# Patient Record
Sex: Male | Born: 1980 | Race: White | Hispanic: No | Marital: Single | Smoking: Current every day smoker
Health system: Southern US, Community
[De-identification: ages and names within clinical notes are randomized; demographics above are authoritative.]

## PROBLEM LIST (undated history)

## (undated) HISTORY — PX: ABDOMINAL SURGERY: SHX537

---

## 2003-05-01 ENCOUNTER — Emergency Department (HOSPITAL_COMMUNITY): Admission: EM | Admit: 2003-05-01 | Discharge: 2003-05-01 | Payer: Self-pay | Admitting: Emergency Medicine

## 2003-05-02 ENCOUNTER — Emergency Department (HOSPITAL_COMMUNITY): Admission: EM | Admit: 2003-05-02 | Discharge: 2003-05-03 | Payer: Self-pay | Admitting: Emergency Medicine

## 2003-05-05 ENCOUNTER — Emergency Department (HOSPITAL_COMMUNITY): Admission: EM | Admit: 2003-05-05 | Discharge: 2003-05-05 | Payer: Self-pay | Admitting: Emergency Medicine

## 2003-05-08 ENCOUNTER — Emergency Department (HOSPITAL_COMMUNITY): Admission: EM | Admit: 2003-05-08 | Discharge: 2003-05-08 | Payer: Self-pay | Admitting: Emergency Medicine

## 2003-09-04 ENCOUNTER — Emergency Department (HOSPITAL_COMMUNITY): Admission: EM | Admit: 2003-09-04 | Discharge: 2003-09-04 | Payer: Self-pay | Admitting: Emergency Medicine

## 2003-09-06 ENCOUNTER — Emergency Department (HOSPITAL_COMMUNITY): Admission: EM | Admit: 2003-09-06 | Discharge: 2003-09-07 | Payer: Self-pay | Admitting: Emergency Medicine

## 2003-11-23 ENCOUNTER — Emergency Department (HOSPITAL_COMMUNITY): Admission: EM | Admit: 2003-11-23 | Discharge: 2003-11-23 | Payer: Self-pay | Admitting: Emergency Medicine

## 2003-12-02 ENCOUNTER — Emergency Department: Payer: Self-pay | Admitting: Emergency Medicine

## 2004-03-02 ENCOUNTER — Emergency Department (HOSPITAL_COMMUNITY): Admission: EM | Admit: 2004-03-02 | Discharge: 2004-03-02 | Payer: Self-pay | Admitting: Emergency Medicine

## 2004-03-06 ENCOUNTER — Emergency Department (HOSPITAL_COMMUNITY): Admission: EM | Admit: 2004-03-06 | Discharge: 2004-03-06 | Payer: Self-pay | Admitting: *Deleted

## 2004-03-24 ENCOUNTER — Emergency Department (HOSPITAL_COMMUNITY): Admission: EM | Admit: 2004-03-24 | Discharge: 2004-03-24 | Payer: Self-pay | Admitting: Emergency Medicine

## 2004-04-06 ENCOUNTER — Emergency Department (HOSPITAL_COMMUNITY): Admission: EM | Admit: 2004-04-06 | Discharge: 2004-04-06 | Payer: Self-pay | Admitting: Emergency Medicine

## 2004-04-07 ENCOUNTER — Emergency Department (HOSPITAL_COMMUNITY): Admission: EM | Admit: 2004-04-07 | Discharge: 2004-04-07 | Payer: Self-pay | Admitting: Emergency Medicine

## 2004-05-06 ENCOUNTER — Emergency Department: Payer: Self-pay | Admitting: Emergency Medicine

## 2004-05-07 ENCOUNTER — Emergency Department: Payer: Self-pay | Admitting: Unknown Physician Specialty

## 2004-05-15 ENCOUNTER — Emergency Department: Payer: Self-pay | Admitting: Unknown Physician Specialty

## 2004-05-19 ENCOUNTER — Emergency Department: Payer: Self-pay | Admitting: Emergency Medicine

## 2004-05-20 ENCOUNTER — Emergency Department: Payer: Self-pay | Admitting: Unknown Physician Specialty

## 2004-05-22 ENCOUNTER — Emergency Department: Payer: Self-pay | Admitting: Unknown Physician Specialty

## 2004-06-14 ENCOUNTER — Emergency Department: Payer: Self-pay | Admitting: Emergency Medicine

## 2004-06-15 ENCOUNTER — Emergency Department: Payer: Self-pay | Admitting: Emergency Medicine

## 2004-08-06 ENCOUNTER — Emergency Department: Payer: Self-pay | Admitting: Emergency Medicine

## 2004-08-25 ENCOUNTER — Emergency Department: Payer: Self-pay | Admitting: Emergency Medicine

## 2004-09-04 ENCOUNTER — Emergency Department: Payer: Self-pay | Admitting: Internal Medicine

## 2004-09-16 ENCOUNTER — Emergency Department (HOSPITAL_COMMUNITY): Admission: EM | Admit: 2004-09-16 | Discharge: 2004-09-16 | Payer: Self-pay | Admitting: Emergency Medicine

## 2004-09-24 ENCOUNTER — Emergency Department (HOSPITAL_COMMUNITY): Admission: EM | Admit: 2004-09-24 | Discharge: 2004-09-24 | Payer: Self-pay | Admitting: Emergency Medicine

## 2004-10-03 ENCOUNTER — Emergency Department: Payer: Self-pay | Admitting: Emergency Medicine

## 2004-11-05 ENCOUNTER — Emergency Department: Payer: Self-pay | Admitting: Unknown Physician Specialty

## 2004-11-13 ENCOUNTER — Emergency Department (HOSPITAL_COMMUNITY): Admission: EM | Admit: 2004-11-13 | Discharge: 2004-11-13 | Payer: Self-pay | Admitting: Emergency Medicine

## 2004-11-20 ENCOUNTER — Emergency Department: Payer: Self-pay | Admitting: Emergency Medicine

## 2004-11-20 ENCOUNTER — Emergency Department (HOSPITAL_COMMUNITY): Admission: EM | Admit: 2004-11-20 | Discharge: 2004-11-21 | Payer: Self-pay | Admitting: *Deleted

## 2004-11-27 ENCOUNTER — Emergency Department (HOSPITAL_COMMUNITY): Admission: EM | Admit: 2004-11-27 | Discharge: 2004-11-27 | Payer: Self-pay | Admitting: Emergency Medicine

## 2004-12-08 ENCOUNTER — Emergency Department: Payer: Self-pay | Admitting: Internal Medicine

## 2004-12-12 ENCOUNTER — Emergency Department: Payer: Self-pay | Admitting: Unknown Physician Specialty

## 2004-12-28 ENCOUNTER — Emergency Department (HOSPITAL_COMMUNITY): Admission: EM | Admit: 2004-12-28 | Discharge: 2004-12-28 | Payer: Self-pay | Admitting: Emergency Medicine

## 2005-03-20 ENCOUNTER — Emergency Department (HOSPITAL_COMMUNITY): Admission: EM | Admit: 2005-03-20 | Discharge: 2005-03-20 | Payer: Self-pay | Admitting: Emergency Medicine

## 2005-03-23 ENCOUNTER — Inpatient Hospital Stay: Payer: Self-pay | Admitting: Unknown Physician Specialty

## 2005-04-08 ENCOUNTER — Emergency Department (HOSPITAL_COMMUNITY): Admission: EM | Admit: 2005-04-08 | Discharge: 2005-04-08 | Payer: Self-pay | Admitting: Emergency Medicine

## 2005-04-10 ENCOUNTER — Emergency Department: Payer: Self-pay | Admitting: Emergency Medicine

## 2005-05-31 ENCOUNTER — Inpatient Hospital Stay: Payer: Self-pay | Admitting: Internal Medicine

## 2005-05-31 ENCOUNTER — Other Ambulatory Visit: Payer: Self-pay

## 2005-06-02 ENCOUNTER — Inpatient Hospital Stay: Payer: Self-pay | Admitting: Unknown Physician Specialty

## 2005-06-09 ENCOUNTER — Emergency Department: Payer: Self-pay | Admitting: Emergency Medicine

## 2005-08-10 ENCOUNTER — Emergency Department: Payer: Self-pay

## 2005-08-11 ENCOUNTER — Emergency Department: Payer: Self-pay | Admitting: Emergency Medicine

## 2005-08-23 ENCOUNTER — Emergency Department: Payer: Self-pay | Admitting: Emergency Medicine

## 2005-09-09 ENCOUNTER — Emergency Department: Payer: Self-pay | Admitting: Emergency Medicine

## 2005-09-20 ENCOUNTER — Emergency Department: Payer: Self-pay | Admitting: Unknown Physician Specialty

## 2005-11-06 ENCOUNTER — Emergency Department: Payer: Self-pay | Admitting: Emergency Medicine

## 2005-11-11 IMAGING — CR DG LUMBAR SPINE 2-3V
1 series · 2 of 2 positions shown · non-contrast
Comparison: none

REASON FOR EXAM: fall
COMMENTS:

[Series 1: view not recorded · 0.17mm/px · 2 of 2 slices shown]
[im 1/2]
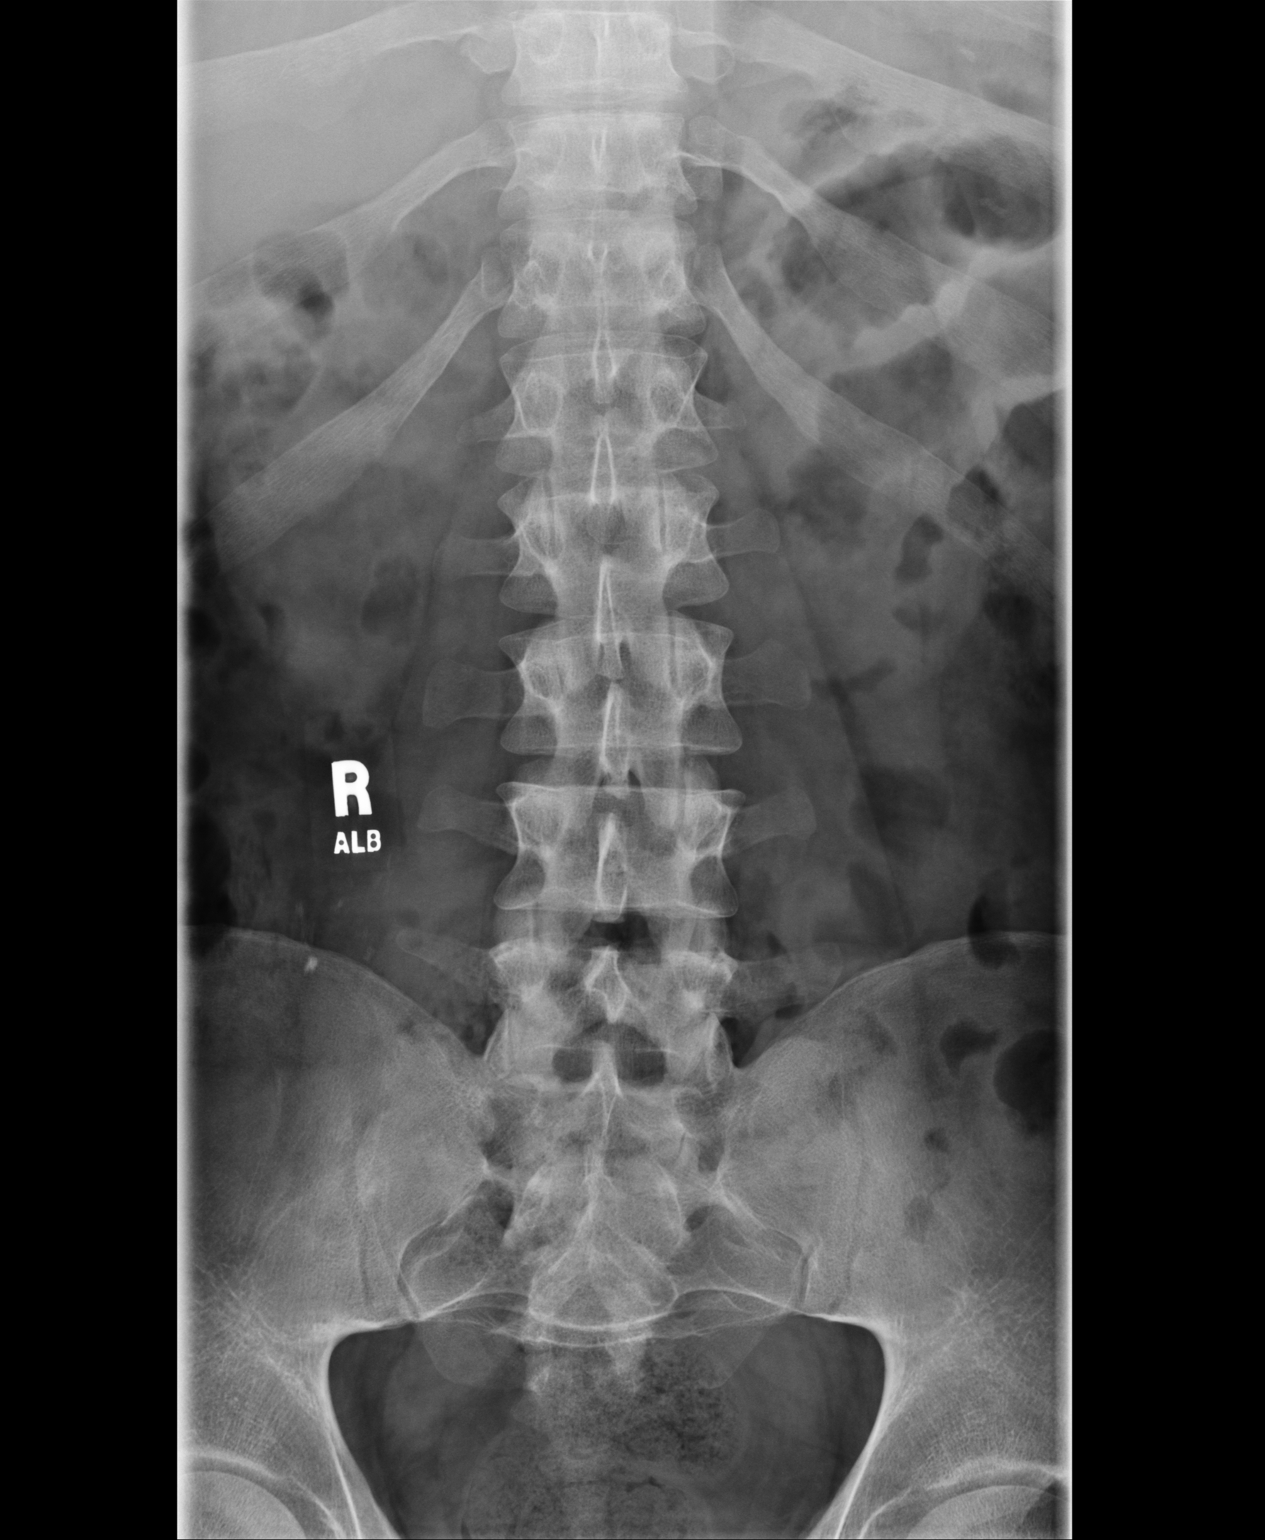
[im 2/2]
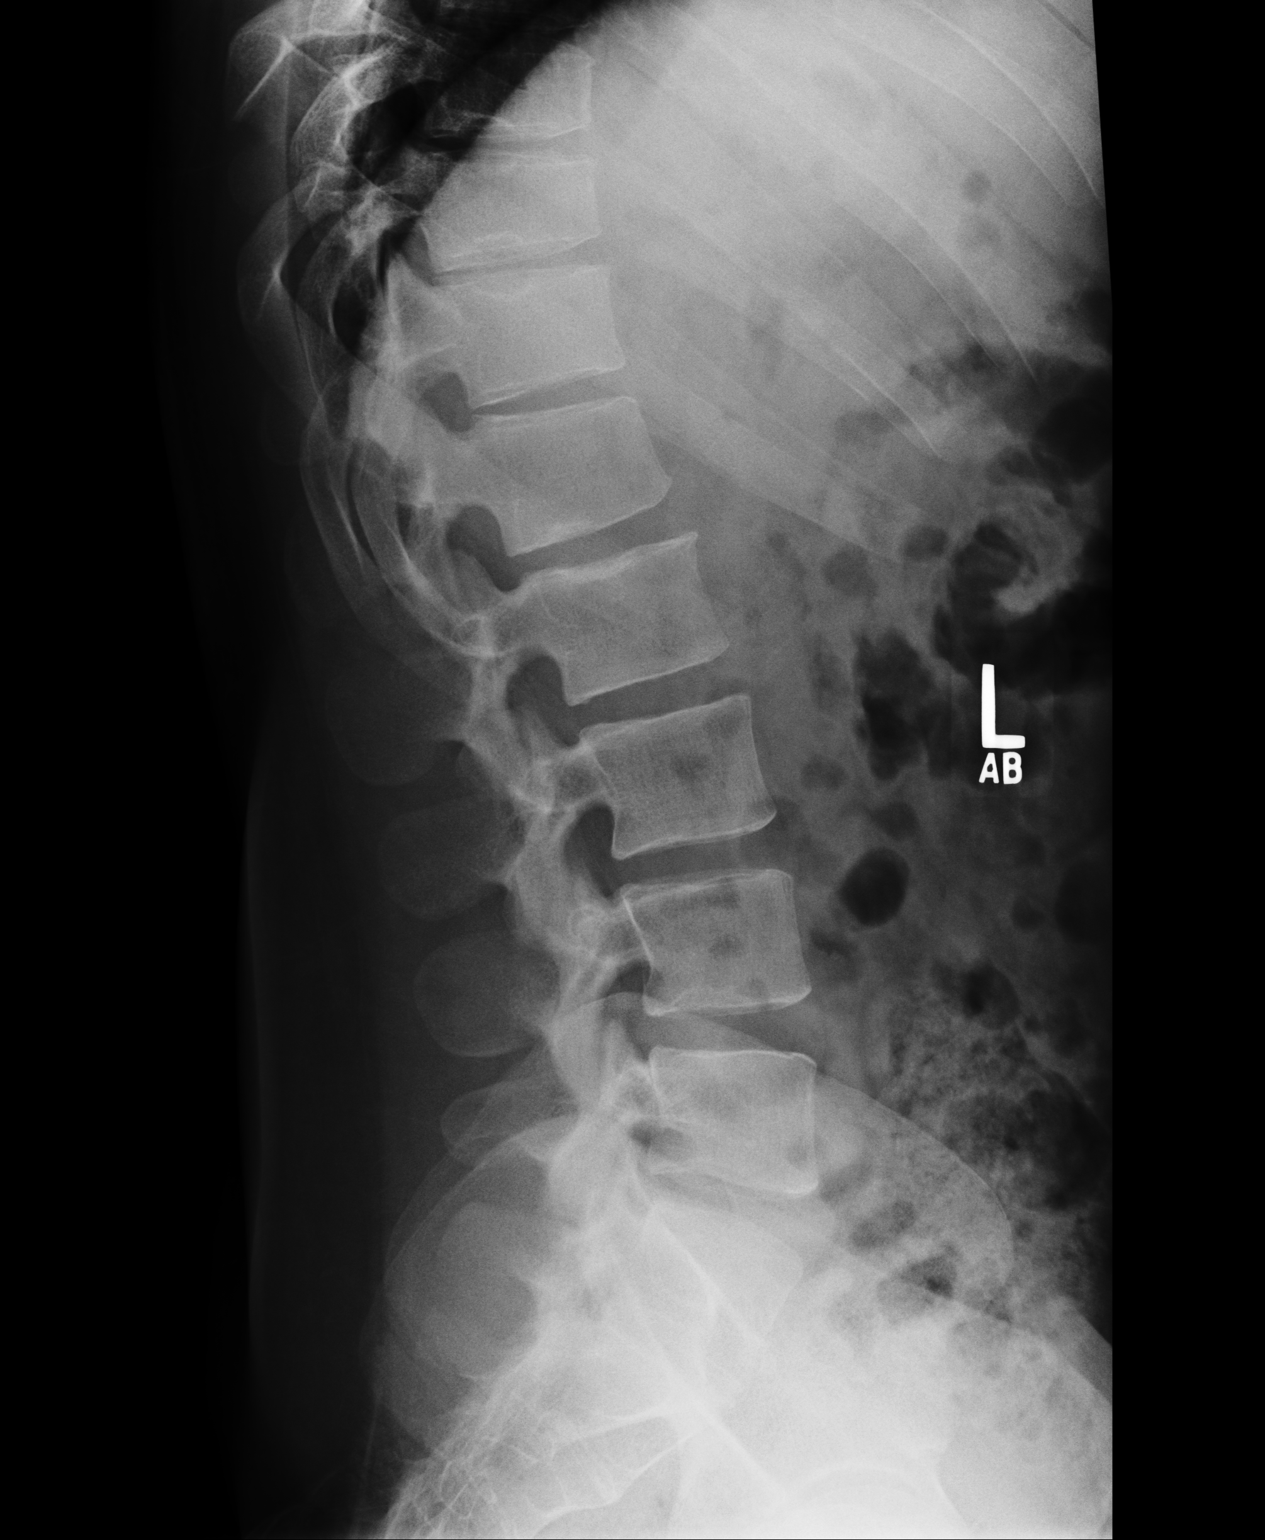

[2 of 2 positions shown; findings below may reference images not displayed]

PROCEDURE:     DXR - DXR LUMBAR SPINE AP AND LATERAL  - June 14, 2004  [DATE]

RESULT:        Comparison is made to the prior study which was just
performed on 05/20/04.  The vertebral body heights and intervertebral disc
spaces are maintained.  There is no evidence of fracture or subluxation.  No
significant degenerative change is seen.  There is no interval change.
IMPRESSION: No evidence of an acute lumbar spine bony abnormality.

## 2005-11-11 IMAGING — CR DG LUMBAR SPINE 1V
1 series · 1 of 1 positions shown · non-contrast
Comparison: none

REASON FOR EXAM: fall
COMMENTS:

PROCEDURE:     DXR - DXR LUMBAR SPINE ONE VIEW ONLY  - June 14, 2004 [DATE]
RESULT:        Cross-table lateral view of the lumbar spine shows normal
alignment.  The vertebral body heights are maintained.   No significant disc
space narrowing is seen.

[view not recorded]
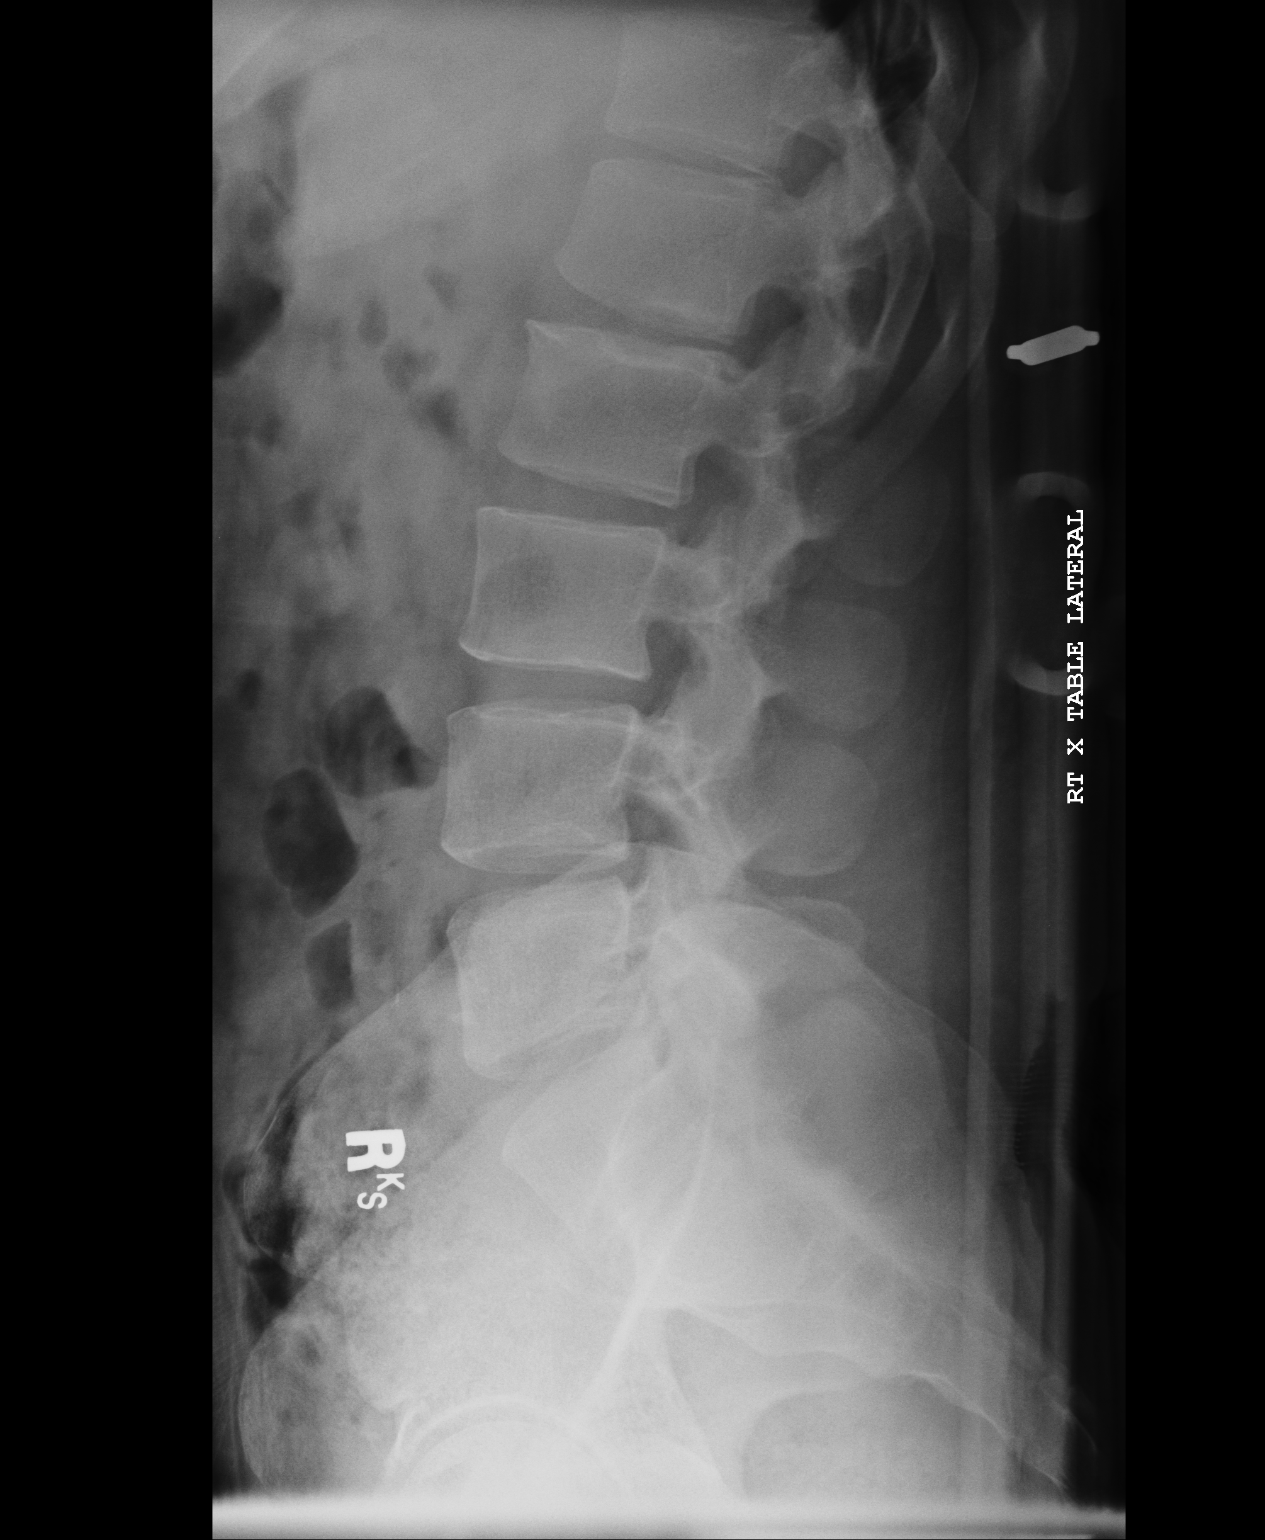

[1 of 1 positions shown; findings below may reference images not displayed]

IMPRESSION: See above.

## 2005-11-11 IMAGING — CR PELVIS - 1-2 VIEW
1 series · 1 of 1 positions shown · non-contrast
Comparison: none

REASON FOR EXAM: fall
COMMENTS:

[view not recorded]
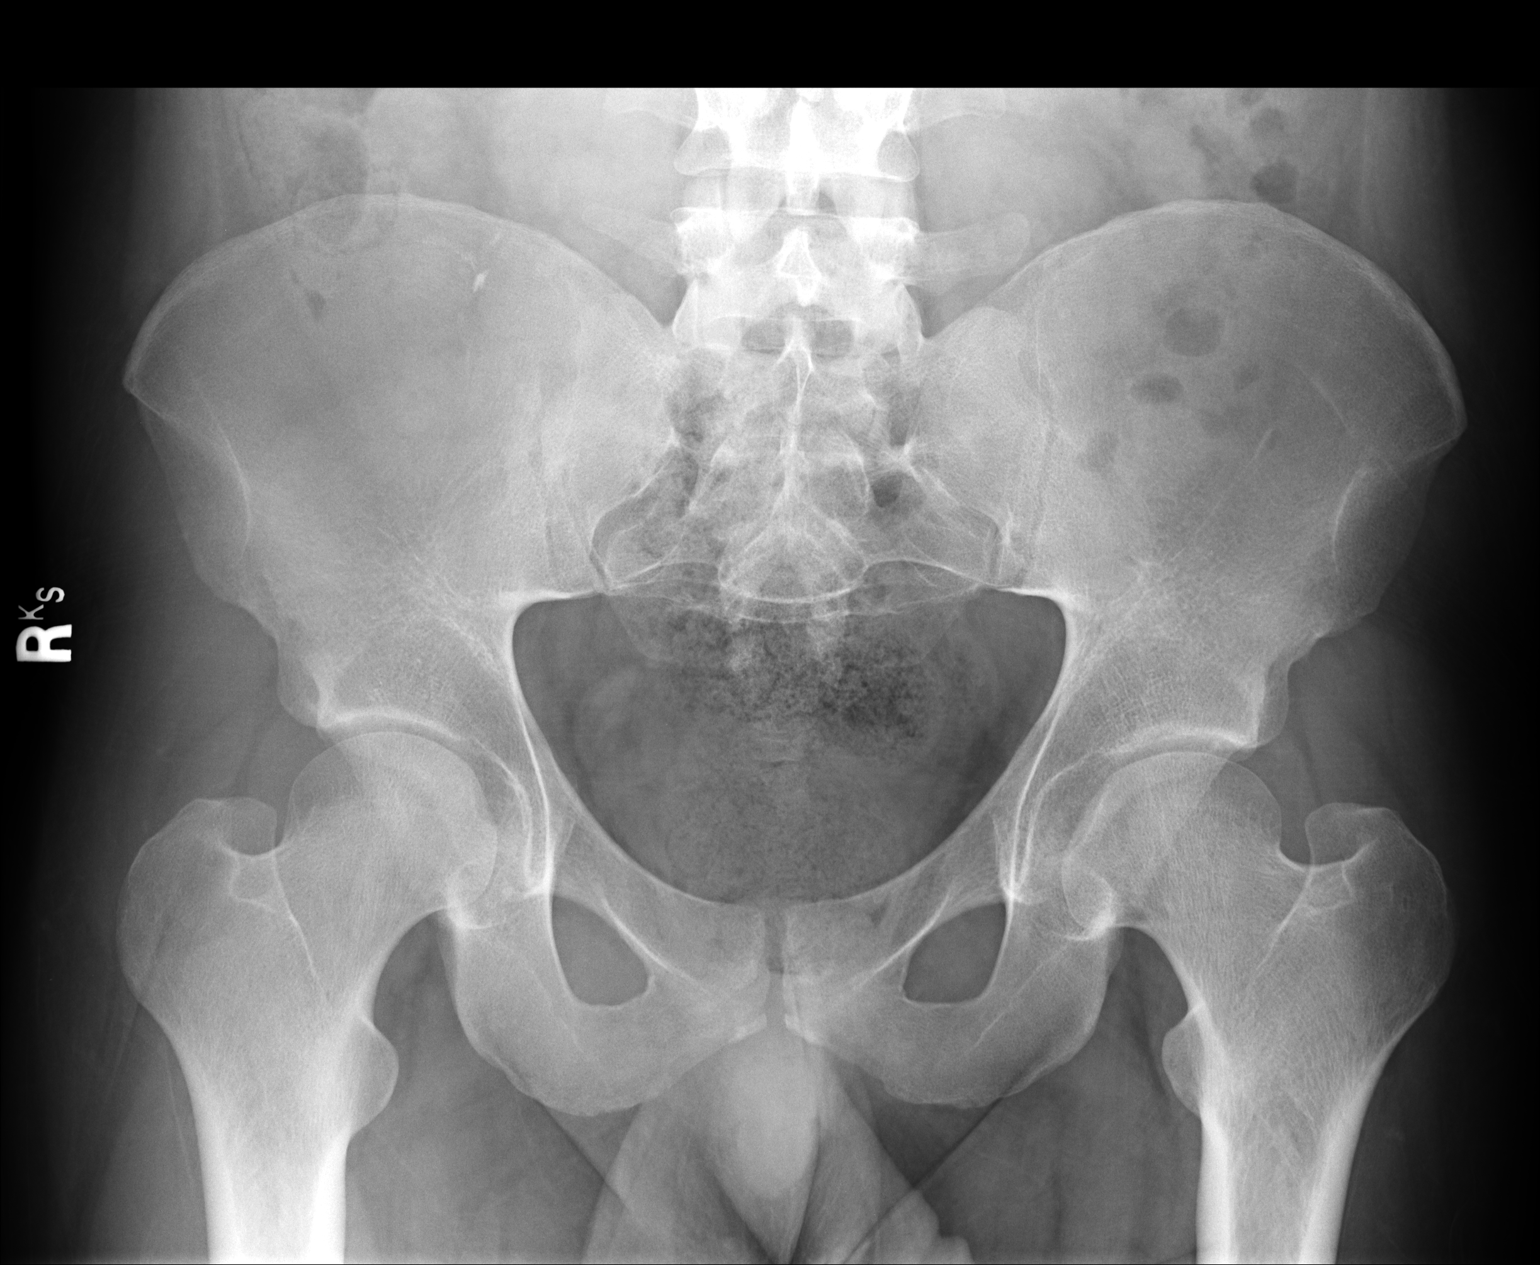

[1 of 1 positions shown; findings below may reference images not displayed]

PROCEDURE:     DXR - DXR PELVIS AP ONLY  - June 14, 2004 [DATE]

RESULT:       A single AP view of the pelvis shows normal appearance of the
pubic symphysis and sacroiliac joints. The sacrum appears intact.  There is
no evidence of fracture.  Some minimal density projects over the RIGHT iliac
crest and may represent some ingested material or medication.  The
visualized bowel gas pattern is unremarkable.
IMPRESSION: See above.

## 2006-04-11 ENCOUNTER — Emergency Department: Payer: Self-pay | Admitting: Emergency Medicine

## 2006-05-11 IMAGING — CR PELVIS - 1-2 VIEW
1 series · 2 of 2 positions shown · non-contrast
Comparison: none

REASON FOR EXAM: Fall
COMMENTS:  LMP: (Male)

PROCEDURE:     DXR - DXR PELVIS AP ONLY  - December 12, 2004  [DATE]
RESULT:          Comparison is made to a prior study dated 06/14/2004.
There is no evidence of fracture, dislocation, or malalignment.  A paucity
of air is seen within nondilated loops of bowel.

[Series 1: view not recorded · 0.17mm/px · 2 of 2 slices shown]
[im 1/2]
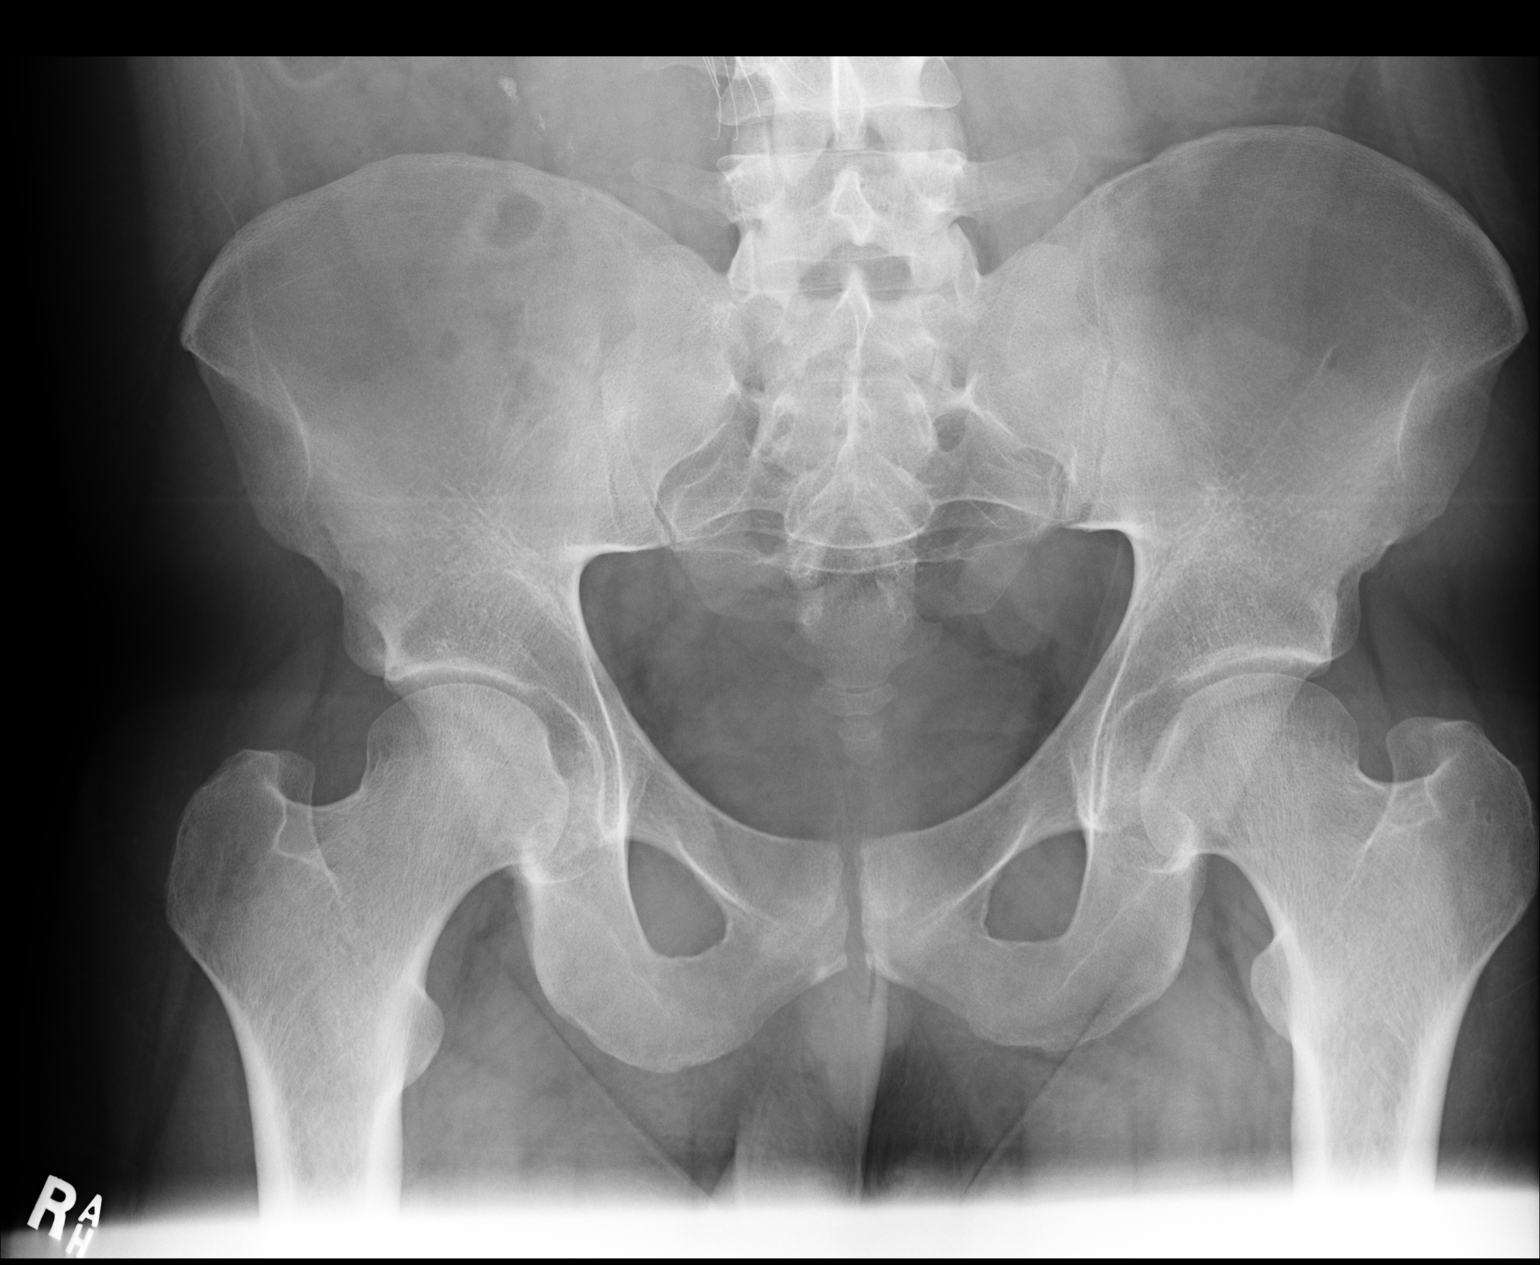
[im 2/2]
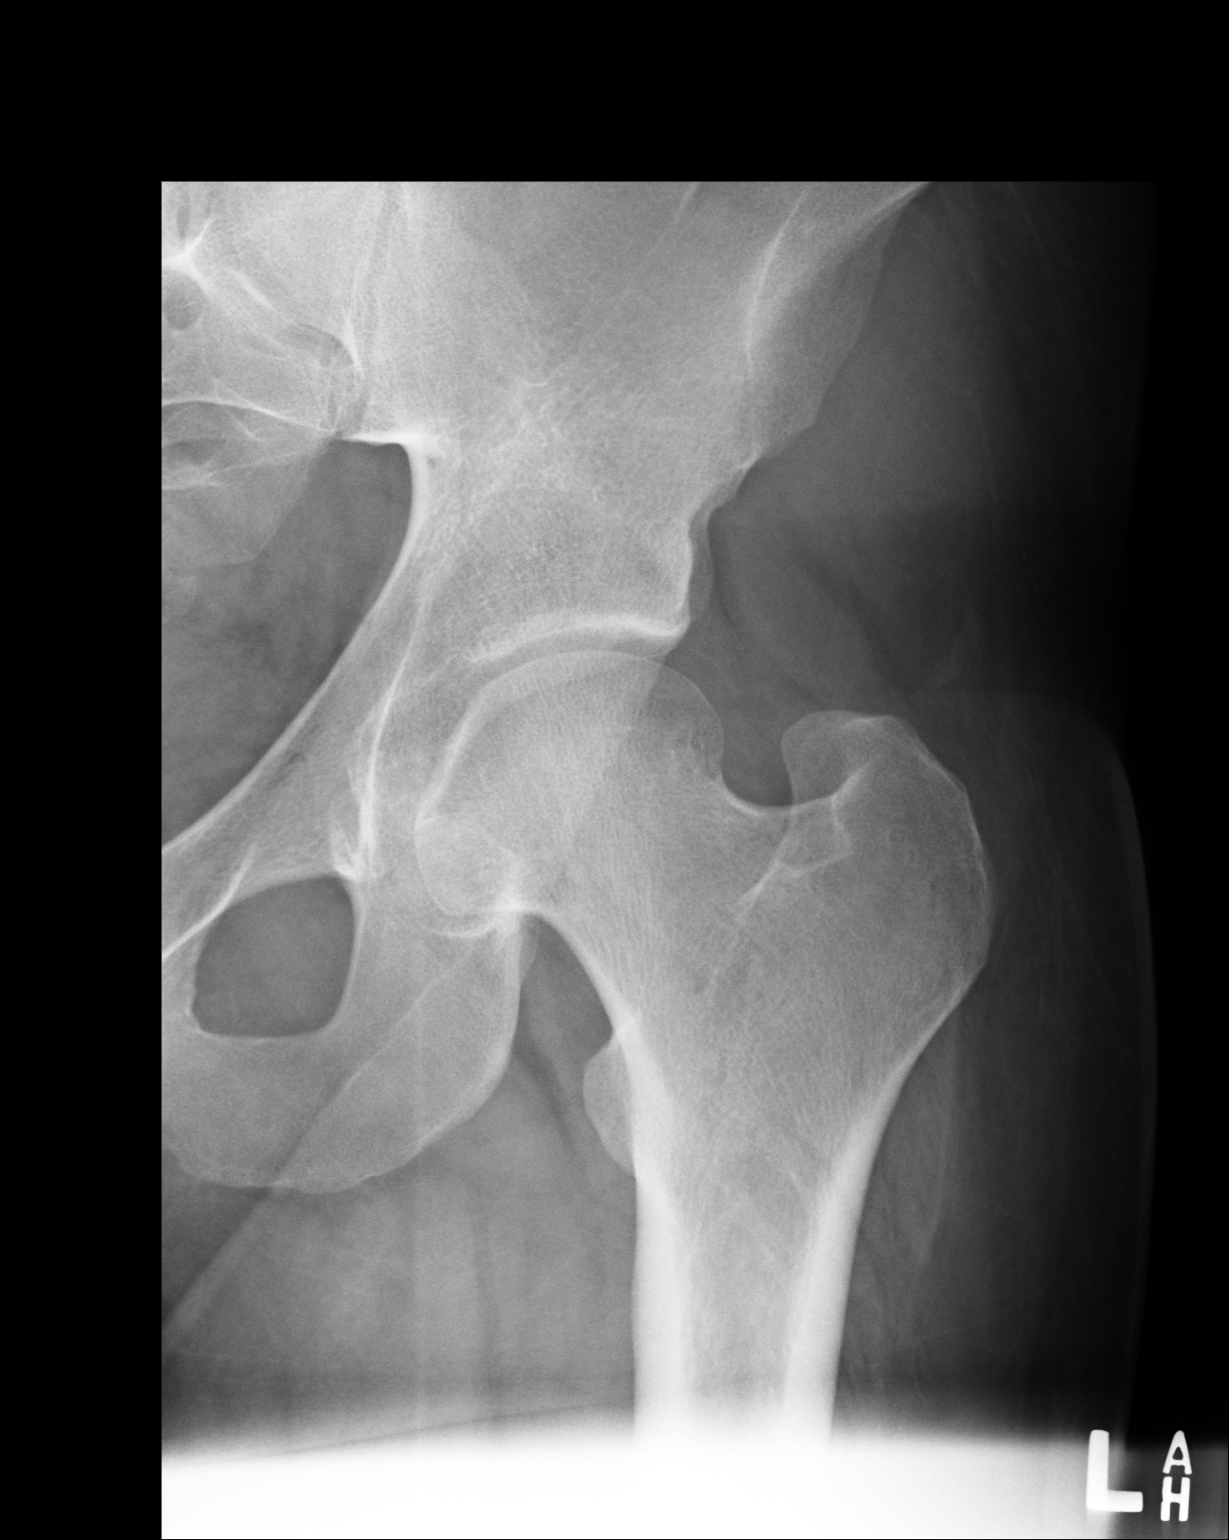

[2 of 2 positions shown; findings below may reference images not displayed]

IMPRESSION: 1.     No evidence of acute abnormalities.
2.     If there is persistent clinical concern or persistent complaints of
pain, repeat evaluation in 7-10 days is recommended, or further evaluation
with fracture protocol MRI.

## 2006-05-17 ENCOUNTER — Emergency Department: Payer: Self-pay | Admitting: General Practice

## 2007-03-18 ENCOUNTER — Emergency Department: Payer: Self-pay | Admitting: Emergency Medicine

## 2007-07-30 ENCOUNTER — Emergency Department: Payer: Self-pay | Admitting: Emergency Medicine

## 2007-10-17 ENCOUNTER — Emergency Department: Payer: Self-pay | Admitting: Emergency Medicine

## 2007-10-17 ENCOUNTER — Other Ambulatory Visit: Payer: Self-pay

## 2007-12-13 ENCOUNTER — Emergency Department: Payer: Self-pay | Admitting: Emergency Medicine

## 2008-01-06 ENCOUNTER — Emergency Department: Payer: Self-pay | Admitting: Emergency Medicine

## 2008-05-20 ENCOUNTER — Emergency Department: Payer: Self-pay | Admitting: Emergency Medicine

## 2008-07-09 ENCOUNTER — Emergency Department: Payer: Self-pay | Admitting: Emergency Medicine

## 2008-07-10 ENCOUNTER — Emergency Department: Payer: Self-pay | Admitting: Internal Medicine

## 2008-07-13 ENCOUNTER — Ambulatory Visit: Payer: Self-pay | Admitting: Surgery

## 2008-07-22 ENCOUNTER — Emergency Department: Payer: Self-pay | Admitting: Emergency Medicine

## 2008-07-27 ENCOUNTER — Inpatient Hospital Stay: Payer: Self-pay | Admitting: Vascular Surgery

## 2008-09-13 ENCOUNTER — Emergency Department: Payer: Self-pay | Admitting: Emergency Medicine

## 2008-10-05 ENCOUNTER — Emergency Department: Payer: Self-pay | Admitting: Emergency Medicine

## 2008-10-17 ENCOUNTER — Emergency Department: Payer: Self-pay | Admitting: Emergency Medicine

## 2008-12-04 ENCOUNTER — Emergency Department: Payer: Self-pay | Admitting: Emergency Medicine

## 2008-12-15 ENCOUNTER — Emergency Department: Payer: Self-pay | Admitting: Unknown Physician Specialty

## 2009-01-24 ENCOUNTER — Emergency Department: Payer: Self-pay | Admitting: Internal Medicine

## 2009-02-26 ENCOUNTER — Emergency Department: Payer: Self-pay | Admitting: Unknown Physician Specialty

## 2009-04-11 ENCOUNTER — Emergency Department: Payer: Self-pay | Admitting: Emergency Medicine

## 2009-04-18 ENCOUNTER — Emergency Department: Payer: Self-pay | Admitting: Emergency Medicine

## 2009-06-04 IMAGING — CR DG ABDOMEN 3V
1 series · 5 of 5 positions shown · non-contrast
Comparison: none

REASON FOR EXAM: Pain
COMMENTS:

[Series 1: view not recorded · 0.17mm/px · 5 of 5 slices shown]
[im 1/5]
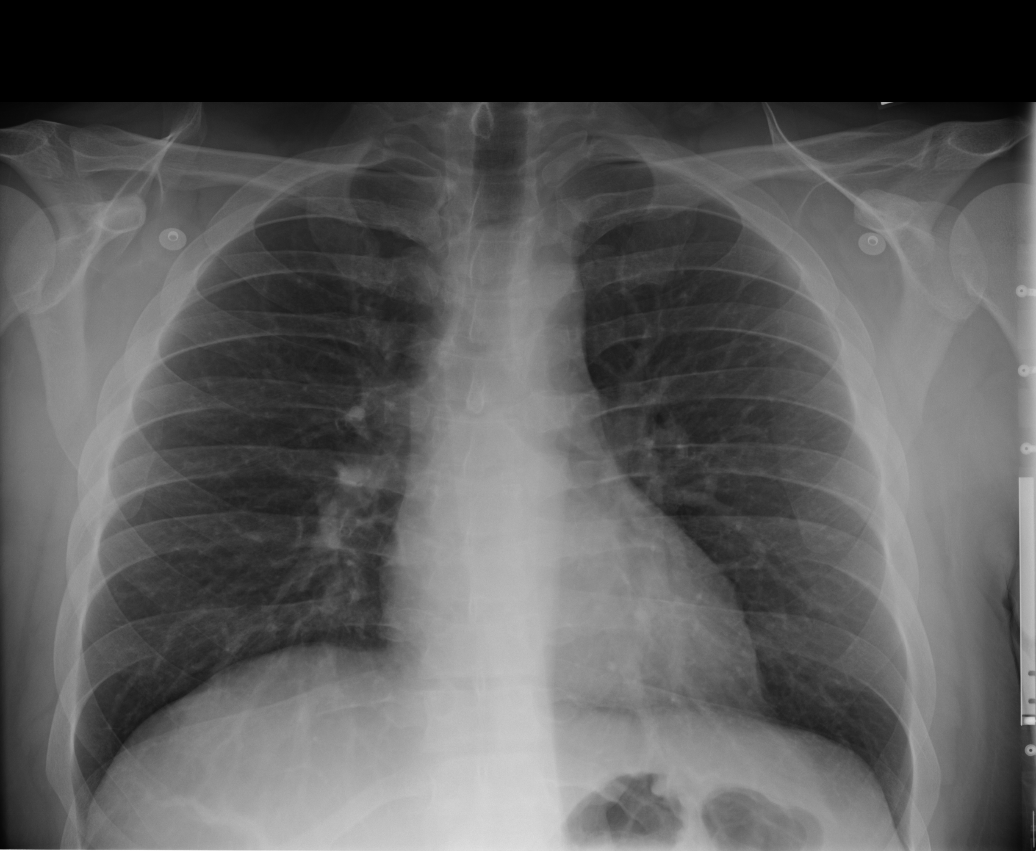
[im 2/5]
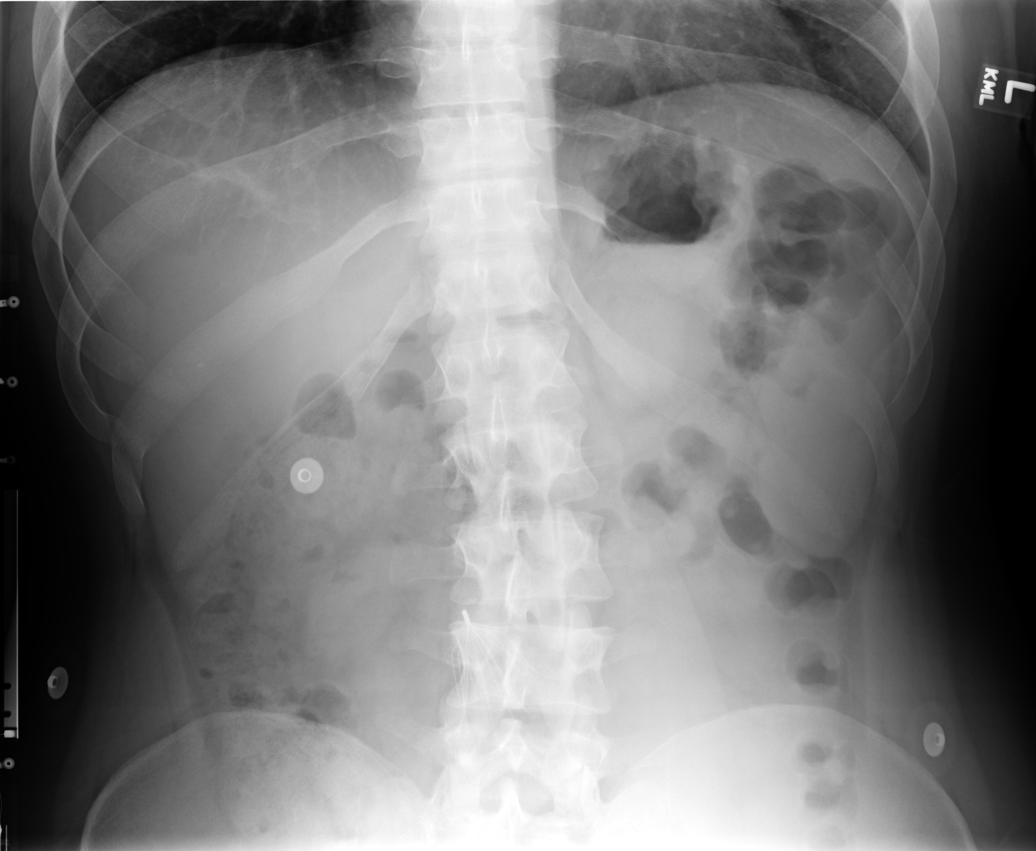
[im 3/5]
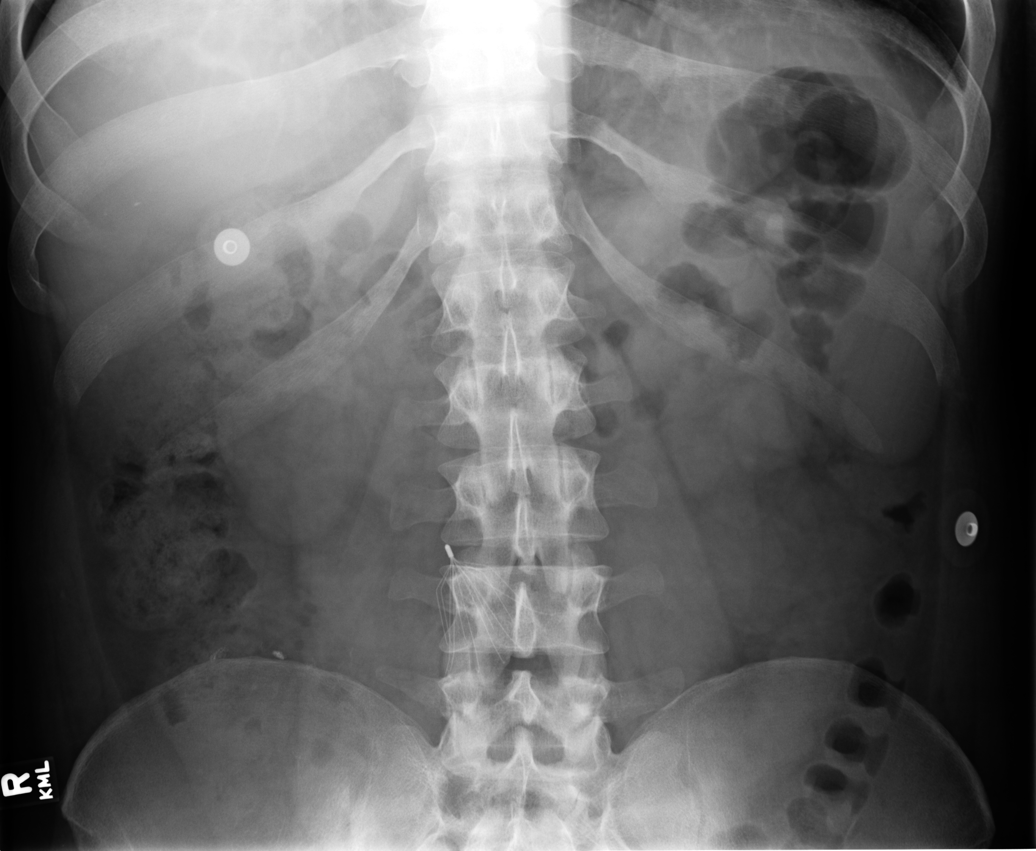
[im 4/5]
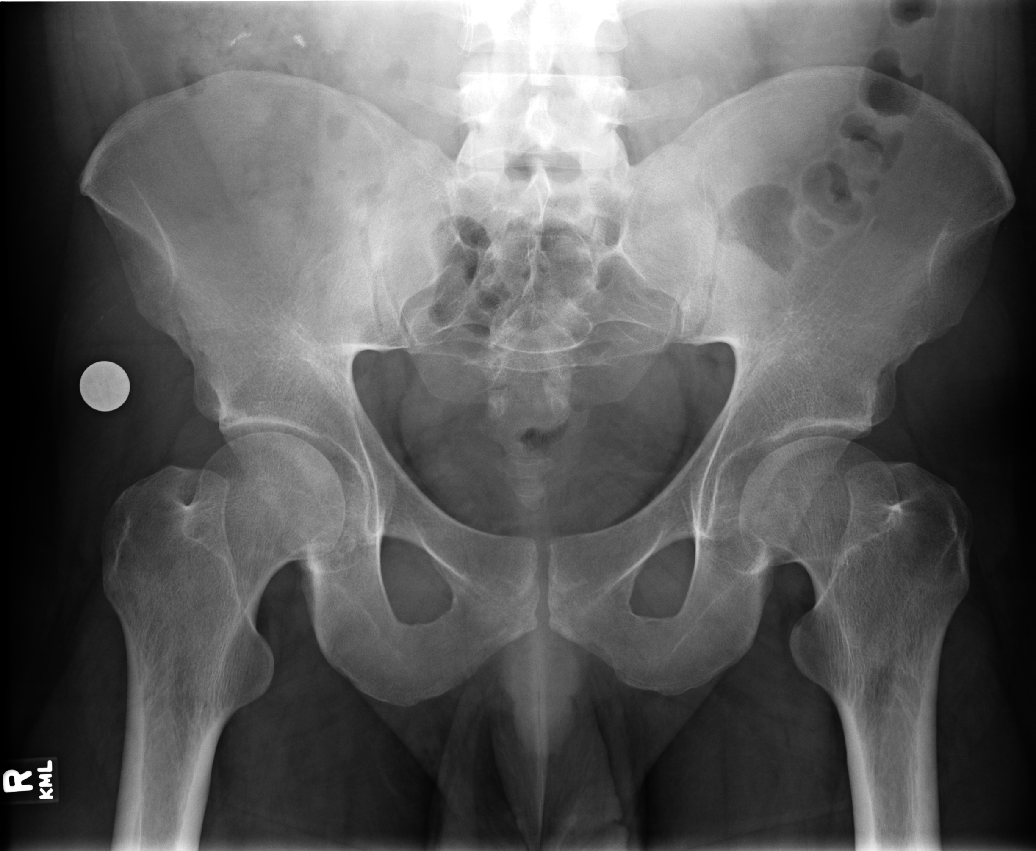
[im 5/5]
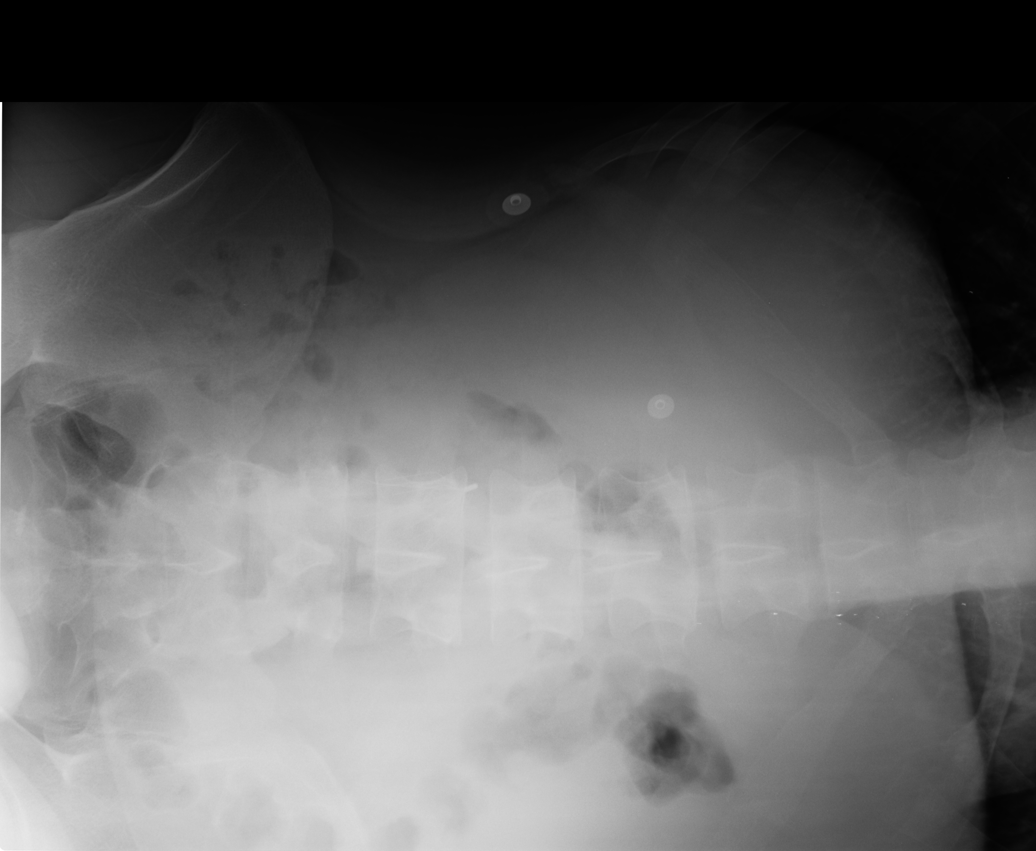

[5 of 5 positions shown; findings below may reference images not displayed]

PROCEDURE:     DXR - DXR ABDOMEN 3-WAY (INCL PA CXR)  - January 06, 2008  [DATE]

RESULT:     There is no previous exam available for comparison.

The lungs are clear. The cardiac silhouette and pulmonary vasculature appear
to be normal.

The bowel gas pattern shows no abnormal distention. There is an inferior
vena cava filter present. No definite urinary tract stones are evident. The
bladder contains a moderate amount of urine. No free air is evident.
IMPRESSION: 1.  No acute cardiopulmonary disease.
2.  No evidence of abnormal bowel distention.

## 2009-06-09 ENCOUNTER — Emergency Department: Payer: Self-pay | Admitting: Emergency Medicine

## 2009-07-23 ENCOUNTER — Emergency Department: Payer: Self-pay | Admitting: Unknown Physician Specialty

## 2009-07-24 ENCOUNTER — Emergency Department: Payer: Self-pay | Admitting: Emergency Medicine

## 2009-08-03 ENCOUNTER — Emergency Department: Payer: Self-pay | Admitting: Emergency Medicine

## 2009-08-13 ENCOUNTER — Emergency Department: Payer: Self-pay | Admitting: Emergency Medicine

## 2009-08-29 ENCOUNTER — Emergency Department: Payer: Self-pay | Admitting: Emergency Medicine

## 2009-10-04 ENCOUNTER — Emergency Department: Payer: Self-pay | Admitting: Emergency Medicine

## 2009-11-12 ENCOUNTER — Emergency Department: Payer: Self-pay | Admitting: Emergency Medicine

## 2009-12-17 ENCOUNTER — Emergency Department (HOSPITAL_COMMUNITY)
Admission: EM | Admit: 2009-12-17 | Discharge: 2009-12-17 | Payer: Self-pay | Source: Home / Self Care | Admitting: Emergency Medicine

## 2010-06-10 ENCOUNTER — Emergency Department: Payer: Self-pay | Admitting: Internal Medicine

## 2010-07-12 ENCOUNTER — Emergency Department (HOSPITAL_COMMUNITY)
Admission: EM | Admit: 2010-07-12 | Discharge: 2010-07-12 | Disposition: A | Discharge status: Home / Self Care | Payer: Self-pay | Attending: Emergency Medicine | Admitting: Emergency Medicine

## 2010-07-12 DIAGNOSIS — K0381 Cracked tooth: Secondary | ICD-10-CM | POA: Insufficient documentation

## 2010-07-12 DIAGNOSIS — R6884 Jaw pain: Secondary | ICD-10-CM | POA: Insufficient documentation

## 2010-07-12 DIAGNOSIS — K089 Disorder of teeth and supporting structures, unspecified: Secondary | ICD-10-CM | POA: Insufficient documentation

## 2010-07-12 DIAGNOSIS — K029 Dental caries, unspecified: Secondary | ICD-10-CM | POA: Insufficient documentation

## 2010-07-24 ENCOUNTER — Emergency Department: Payer: Self-pay | Admitting: Emergency Medicine

## 2010-09-16 ENCOUNTER — Emergency Department: Payer: Self-pay | Admitting: *Deleted

## 2010-09-16 ENCOUNTER — Emergency Department (HOSPITAL_COMMUNITY)
Admission: EM | Admit: 2010-09-16 | Discharge: 2010-09-16 | Disposition: A | Discharge status: Home / Self Care | Payer: Self-pay | Attending: Emergency Medicine | Admitting: Emergency Medicine

## 2010-09-16 DIAGNOSIS — K029 Dental caries, unspecified: Secondary | ICD-10-CM | POA: Insufficient documentation

## 2010-09-16 DIAGNOSIS — K089 Disorder of teeth and supporting structures, unspecified: Secondary | ICD-10-CM | POA: Insufficient documentation

## 2010-09-16 DIAGNOSIS — F17200 Nicotine dependence, unspecified, uncomplicated: Secondary | ICD-10-CM | POA: Insufficient documentation

## 2010-09-19 ENCOUNTER — Emergency Department (HOSPITAL_COMMUNITY)
Admission: EM | Admit: 2010-09-19 | Discharge: 2010-09-19 | Disposition: A | Discharge status: Home / Self Care | Payer: Self-pay | Attending: Emergency Medicine | Admitting: Emergency Medicine

## 2010-09-19 DIAGNOSIS — K089 Disorder of teeth and supporting structures, unspecified: Secondary | ICD-10-CM | POA: Insufficient documentation

## 2010-09-19 DIAGNOSIS — R220 Localized swelling, mass and lump, head: Secondary | ICD-10-CM | POA: Insufficient documentation

## 2010-09-19 DIAGNOSIS — K029 Dental caries, unspecified: Secondary | ICD-10-CM | POA: Insufficient documentation

## 2010-09-19 DIAGNOSIS — F17200 Nicotine dependence, unspecified, uncomplicated: Secondary | ICD-10-CM | POA: Insufficient documentation

## 2010-10-26 ENCOUNTER — Emergency Department: Payer: Self-pay | Admitting: Emergency Medicine

## 2010-12-06 ENCOUNTER — Encounter: Payer: Self-pay | Admitting: *Deleted

## 2010-12-06 ENCOUNTER — Emergency Department (HOSPITAL_COMMUNITY)
Admission: EM | Admit: 2010-12-06 | Discharge: 2010-12-06 | Disposition: A | Discharge status: Home / Self Care | Payer: Self-pay | Attending: Emergency Medicine | Admitting: Emergency Medicine

## 2010-12-06 DIAGNOSIS — K089 Disorder of teeth and supporting structures, unspecified: Secondary | ICD-10-CM | POA: Insufficient documentation

## 2010-12-06 DIAGNOSIS — K029 Dental caries, unspecified: Secondary | ICD-10-CM | POA: Insufficient documentation

## 2010-12-06 DIAGNOSIS — F17200 Nicotine dependence, unspecified, uncomplicated: Secondary | ICD-10-CM | POA: Insufficient documentation

## 2010-12-06 DIAGNOSIS — K0889 Other specified disorders of teeth and supporting structures: Secondary | ICD-10-CM

## 2010-12-06 MED ORDER — OXYCODONE-ACETAMINOPHEN 5-325 MG PO TABS
2.0000 | ORAL_TABLET | Freq: Once | ORAL | Status: AC
  Administered 2010-12-06: 2 via ORAL
  Filled 2010-12-06: qty 2

## 2010-12-06 MED ORDER — CLINDAMYCIN HCL 150 MG PO CAPS: 150.0000 mg | ORAL_CAPSULE | Freq: Four times a day (QID) | ORAL | Status: AC

## 2010-12-06 MED ORDER — PENICILLIN V POTASSIUM 250 MG PO TABS: 250.0000 mg | ORAL_TABLET | Freq: Four times a day (QID) | ORAL | Status: DC

## 2010-12-06 MED ORDER — OXYCODONE-ACETAMINOPHEN 5-325 MG PO TABS: 1.0000 | ORAL_TABLET | Freq: Four times a day (QID) | ORAL | Status: AC | PRN

## 2010-12-06 MED ORDER — IBUPROFEN 800 MG PO TABS: 800.0000 mg | ORAL_TABLET | Freq: Three times a day (TID) | ORAL | Status: AC

## 2010-12-06 NOTE — ED Notes (Signed)
Pt reports variable dental pain x1 month, worse in the past week. Pt states he attempted to go to dental clinic today however they were full. Pt c/o top left tooth pain. Denies fever.

## 2010-12-06 NOTE — ED Notes (Signed)
Pt reports left upper toothache x1 month progressively worse in the last week. Pt attempted to go to free dental clinic however states they were already full. Pt resting comfortably on bed in no acute distress, family at bedside.

## 2010-12-06 NOTE — ED Provider Notes (Signed)
History     CSN: [id number] Arrival date & time: 12/06/2010  6:36 AM   First MD Initiated Contact with Patient 12/06/10 [phone]      Chief Complaint  Patient presents with  . Dental Pain    (Consider location/radiation/quality/duration/timing/severity/associated sxs/prior treatment) Patient is a 30 y.o. male presenting with tooth pain. The history is provided by the patient.  Dental PainThe primary symptoms include mouth pain.   Pt pressents to ED with complaint of dental pain for a month. He says that he has tried to be seen but unable to get an appointmnt and the pain has been worsening in the past week. Denies fevers, chills, excessive swelling, throat swelling.  History reviewed. No pertinent past medical history.  Past Surgical History  Procedure Date  . Abdominal surgery     No family history on file.  History  Substance Use Topics  . Smoking status: Current Everyday Smoker -- 0.5 packs/day  . Smokeless tobacco: Never Used  . Alcohol Use: No      Review of Systems  All other systems reviewed and are negative.    Allergies  Penicillins  Home Medications   Current Outpatient Rx  Name Route Sig Dispense Refill  . IBUPROFEN 200 MG PO TABS Oral Take 200-800 mg by mouth every 6 (six) hours as needed. pain       BP 149/95  Pulse 84  Temp(Src) 98.1 F (36.7 C) (Oral)  Resp 16  Ht 5\' 11"  (1.803 m)  Wt 220 lb (99.791 kg)  BMI 30.68 kg/m2  SpO2 100%  Physical Exam  Nursing note and vitals reviewed. Constitutional: He appears well-developed and well-nourished.  HENT:  Head: Normocephalic and atraumatic.  Mouth/Throat: Dental caries present.  Eyes: Conjunctivae and EOM are normal. Pupils are equal, round, and reactive to light.  Neck: Normal range of motion. Neck supple.  Cardiovascular: Normal rate and regular rhythm.   Pulmonary/Chest: Effort normal and breath sounds normal.    ED Course  Procedures (including critical care time)  Labs Reviewed -  No data to display No results found.   No diagnosis found.    MDM  No EKG.       Dorthula Matas, PA 12/06/10 [phone]

## 2010-12-06 NOTE — ED Notes (Signed)
Rx given x1 D/c instructions reviewed w/ pt and family - pt and family deny any further questions or concerns at present.  

## 2010-12-07 NOTE — ED Provider Notes (Signed)
Medical screening examination/treatment/procedure(s) were performed by non-physician practitioner and as supervising physician I was immediately available for consultation/collaboration.  Jasmine Awe, MD 12/07/10 [phone]

## 2011-01-11 ENCOUNTER — Emergency Department (HOSPITAL_COMMUNITY)
Admission: EM | Admit: 2011-01-11 | Discharge: 2011-01-11 | Disposition: A | Discharge status: Home / Self Care | Payer: Self-pay | Attending: Emergency Medicine | Admitting: Emergency Medicine

## 2011-01-11 ENCOUNTER — Encounter (HOSPITAL_COMMUNITY): Payer: Self-pay | Admitting: *Deleted

## 2011-01-11 DIAGNOSIS — F17200 Nicotine dependence, unspecified, uncomplicated: Secondary | ICD-10-CM | POA: Insufficient documentation

## 2011-01-11 DIAGNOSIS — K029 Dental caries, unspecified: Secondary | ICD-10-CM | POA: Insufficient documentation

## 2011-01-11 DIAGNOSIS — K089 Disorder of teeth and supporting structures, unspecified: Secondary | ICD-10-CM | POA: Insufficient documentation

## 2011-01-11 MED ORDER — HYDROCODONE-ACETAMINOPHEN 5-325 MG PO TABS: 2.0000 | ORAL_TABLET | Freq: Four times a day (QID) | ORAL | Status: AC | PRN

## 2011-01-11 MED ORDER — HYDROCODONE-ACETAMINOPHEN 5-325 MG PO TABS
2.0000 | ORAL_TABLET | Freq: Once | ORAL | Status: AC
  Administered 2011-01-11: 2 via ORAL
  Filled 2011-01-11: qty 2

## 2011-01-11 MED ORDER — CLINDAMYCIN HCL 300 MG PO CAPS: 300.0000 mg | ORAL_CAPSULE | Freq: Three times a day (TID) | ORAL | Status: AC

## 2011-01-11 NOTE — ED Notes (Signed)
Reports dental pain right upper x 1 week, airway intact.

## 2011-01-11 NOTE — ED Provider Notes (Signed)
History     CSN: [id number]  Arrival date & time 01/11/11  1313   First MD Initiated Contact with Patient 01/11/11 1411      Chief Complaint  Patient presents with  . Dental Pain    (Consider location/radiation/quality/duration/timing/severity/associated sxs/prior treatment) HPI Patient is a 30 yo male who presents again for evaluation of dental caries.  He is still taking clindamycin and is planning on following up in a free dental clinic in [facility] though he has not yet been assigned an appointment.  He denies nausea, vomiting, fevers, trismus, difficulty swallowing, or difficulty breathing. History reviewed. No pertinent past medical history.  Past Surgical History  Procedure Date  . Abdominal surgery     History reviewed. No pertinent family history.  History  Substance Use Topics  . Smoking status: Current Everyday Smoker -- 0.5 packs/day  . Smokeless tobacco: Never Used  . Alcohol Use: No      Review of Systems  Constitutional: Negative.   HENT: Positive for dental problem.   Eyes: Negative.   Respiratory: Negative.   Cardiovascular: Negative.   Gastrointestinal: Negative.   Genitourinary: Negative.   Musculoskeletal: Negative.   Skin: Negative.   Neurological: Negative.   Hematological: Negative.   Psychiatric/Behavioral: Negative.   All other systems reviewed and are negative.    Allergies  Penicillins  Home Medications   Current Outpatient Rx  Name Route Sig Dispense Refill  . BC HEADACHE PO Oral Take 1 packet by mouth every 2 (two) hours as needed. For pain     . CLINDAMYCIN HCL 300 MG PO CAPS Oral Take 300 mg by mouth 3 (three) times daily. Has about 7 days left     . IBUPROFEN 200 MG PO TABS Oral Take 200-800 mg by mouth every 6 (six) hours as needed. pain     . CLINDAMYCIN HCL 300 MG PO CAPS Oral Take 1 capsule (300 mg total) by mouth 3 (three) times daily. 21 capsule 0  . HYDROCODONE-ACETAMINOPHEN 5-325 MG PO TABS Oral Take 2 tablets  by mouth every 6 (six) hours as needed for pain. 30 tablet 0    BP 151/82  Pulse 96  Temp(Src) 97.8 F (36.6 C) (Oral)  Resp 20  SpO2 97%  Physical Exam  Constitutional: He is oriented to person, place, and time. He appears well-developed and well-nourished. No distress.  HENT:  Head: Normocephalic and atraumatic.  Mouth/Throat: Dental caries present.       Extensive dental cries with fractured teeth throughout  Eyes: Conjunctivae and EOM are normal. Pupils are equal, round, and reactive to light.  Neck: Normal range of motion.  Cardiovascular: Normal rate.   Pulmonary/Chest: Effort normal.  Musculoskeletal: Normal range of motion.  Neurological: He is alert and oriented to person, place, and time. No cranial nerve deficit. He exhibits normal muscle tone. Coordination normal.  Skin: Skin is warm and dry.    ED Course  Procedures (including critical care time)  Labs Reviewed - No data to display No results found.   1. Dental caries       MDM  Patient presents again with dental caries.  We discussed that his symptoms will not resolve until he gets dental intervention.  He was given a referral to the dentist on call Dr. Lucky Cowboy.  He was told that he must call within 48 hours.  He was also given 2 tabs of vicodin.  He was given an extension of his clindamycin given the degree of his dental  disease.  He was also given a prescription for vicodin and told he must follow-up with a dentist.  Patient was discharged in good condition.        Cyndra Numbers, MD 01/12/11 [phone]

## 2011-03-01 ENCOUNTER — Inpatient Hospital Stay: Payer: Self-pay | Admitting: Internal Medicine

## 2011-03-01 LAB — ETHANOL
Ethanol %: 0.041 % (ref 0.000–0.080)
Ethanol: 41 mg/dL

## 2011-03-01 LAB — COMPREHENSIVE METABOLIC PANEL
Albumin: 3.7 g/dL (ref 3.4–5.0)
Bilirubin,Total: 0.4 mg/dL (ref 0.2–1.0)
Calcium, Total: 8.3 mg/dL — ABNORMAL LOW (ref 8.5–10.1)
Chloride: 108 mmol/L — ABNORMAL HIGH (ref 98–107)
Co2: 24 mmol/L (ref 21–32)
EGFR (African American): >60
EGFR (Non-African Amer.): >60
Potassium: 3 mmol/L — ABNORMAL LOW (ref 3.5–5.1)
SGOT(AST): 128 U/L — ABNORMAL HIGH (ref 15–37)
SGPT (ALT): 66 U/L
Total Protein: 7.3 g/dL (ref 6.4–8.2)

## 2011-03-01 LAB — URINALYSIS, COMPLETE
Bacteria: NONE SEEN
Bilirubin,UR: NEGATIVE
Blood: NEGATIVE
Glucose,UR: NEGATIVE mg/dL (ref 0–75)
Hyaline Cast: 14
Ketone: NEGATIVE
Nitrite: NEGATIVE
Protein: NEGATIVE
RBC,UR: NONE SEEN /HPF (ref 0–5)
Specific Gravity: 1.021 (ref 1.003–1.030)
WBC UR: 1 /HPF (ref 0–5)

## 2011-03-01 LAB — DRUG SCREEN, URINE
Amphetamines, Ur Screen: POSITIVE (ref ?–1000)
Benzodiazepine, Ur Scrn: POSITIVE (ref ?–200)
Cannabinoid 50 Ng, Ur ~~LOC~~: NEGATIVE (ref ?–50)
Cocaine Metabolite,Ur ~~LOC~~: NEGATIVE (ref ?–300)
MDMA (Ecstasy)Ur Screen: NEGATIVE (ref ?–500)
Methadone, Ur Screen: POSITIVE (ref ?–300)

## 2011-03-01 LAB — CBC
MCH: 30.4 pg (ref 26.0–34.0)
MCHC: 33.2 g/dL (ref 32.0–36.0)
MCV: 92 fL (ref 80–100)
RBC: 4.38 10*6/uL — ABNORMAL LOW (ref 4.40–5.90)
RDW: 17.7 % — ABNORMAL HIGH (ref 11.5–14.5)

## 2011-03-01 LAB — ACETAMINOPHEN LEVEL: Acetaminophen: 274 ug/mL

## 2011-03-01 LAB — TSH: Thyroid Stimulating Horm: 3.48 u[IU]/mL

## 2011-03-02 LAB — CBC WITH DIFFERENTIAL/PLATELET
Basophil #: 0.1 10*3/uL (ref 0.0–0.1)
Lymphocyte #: 1.6 10*3/uL (ref 1.0–3.6)
Lymphocyte %: 28.2 %
MCHC: 32.8 g/dL (ref 32.0–36.0)
Monocyte %: 9.2 %
Neutrophil #: 3.4 10*3/uL (ref 1.4–6.5)
Neutrophil %: 58.4 %
Platelet: 170 10*3/uL (ref 150–440)
RDW: 17.4 % — ABNORMAL HIGH (ref 11.5–14.5)
WBC: 5.8 10*3/uL (ref 3.8–10.6)

## 2011-03-02 LAB — BASIC METABOLIC PANEL
Anion Gap: 10 (ref 7–16)
Calcium, Total: 8.3 mg/dL — ABNORMAL LOW (ref 8.5–10.1)
Co2: 24 mmol/L (ref 21–32)
Creatinine: 0.82 mg/dL (ref 0.60–1.30)
EGFR (African American): >60
EGFR (Non-African Amer.): >60
Potassium: 3.4 mmol/L — ABNORMAL LOW (ref 3.5–5.1)
Sodium: 144 mmol/L (ref 136–145)

## 2011-03-02 LAB — MAGNESIUM: Magnesium: 1.8 mg/dL

## 2011-03-02 LAB — HEPATIC FUNCTION PANEL A (ARMC)
Albumin: 3.1 g/dL — ABNORMAL LOW (ref 3.4–5.0)
Alkaline Phosphatase: 49 U/L — ABNORMAL LOW (ref 50–136)
Bilirubin,Total: 0.6 mg/dL (ref 0.2–1.0)
SGOT(AST): 67 U/L — ABNORMAL HIGH (ref 15–37)
SGPT (ALT): 52 U/L
Total Protein: 6.4 g/dL (ref 6.4–8.2)

## 2011-03-02 LAB — ACETAMINOPHEN LEVEL: Acetaminophen: 4 ug/mL — ABNORMAL LOW

## 2011-03-03 LAB — COMPREHENSIVE METABOLIC PANEL
Albumin: 2.8 g/dL — ABNORMAL LOW (ref 3.4–5.0)
Alkaline Phosphatase: 45 U/L — ABNORMAL LOW (ref 50–136)
Anion Gap: 11 (ref 7–16)
BUN: 4 mg/dL — ABNORMAL LOW (ref 7–18)
Calcium, Total: 8.1 mg/dL — ABNORMAL LOW (ref 8.5–10.1)
EGFR (African American): >60
EGFR (Non-African Amer.): >60
Glucose: 87 mg/dL (ref 65–99)
Osmolality: 291 (ref 275–301)
SGOT(AST): 49 U/L — ABNORMAL HIGH (ref 15–37)
SGPT (ALT): 47 U/L
Total Protein: 5.9 g/dL — ABNORMAL LOW (ref 6.4–8.2)

## 2011-05-02 ENCOUNTER — Emergency Department: Payer: Self-pay | Admitting: Emergency Medicine

## 2011-05-04 ENCOUNTER — Emergency Department: Payer: Self-pay | Admitting: Emergency Medicine

## 2011-05-04 LAB — CBC WITH DIFFERENTIAL/PLATELET
Eosinophil #: 0.7 10*3/uL (ref 0.0–0.7)
Eosinophil %: 8.8 %
HCT: 41.7 % (ref 40.0–52.0)
HGB: 13.6 g/dL (ref 13.0–18.0)
Lymphocyte %: 26.6 %
MCHC: 32.7 g/dL (ref 32.0–36.0)
MCV: 93 fL (ref 80–100)
Monocyte #: 0.6 x10 3/mm (ref 0.2–1.0)
Neutrophil #: 4.3 10*3/uL (ref 1.4–6.5)
Neutrophil %: 55 %
WBC: 7.9 10*3/uL (ref 3.8–10.6)

## 2011-05-04 LAB — COMPREHENSIVE METABOLIC PANEL
Albumin: 3.8 g/dL (ref 3.4–5.0)
Anion Gap: 8 (ref 7–16)
BUN: 18 mg/dL (ref 7–18)
Bilirubin,Total: 0.3 mg/dL (ref 0.2–1.0)
Calcium, Total: 8.4 mg/dL — ABNORMAL LOW (ref 8.5–10.1)
Chloride: 109 mmol/L — ABNORMAL HIGH (ref 98–107)
Creatinine: 1.04 mg/dL (ref 0.60–1.30)
EGFR (Non-African Amer.): >60
Potassium: 4 mmol/L (ref 3.5–5.1)
SGOT(AST): 36 U/L (ref 15–37)
Sodium: 141 mmol/L (ref 136–145)

## 2011-05-04 LAB — PROTIME-INR: Prothrombin Time: 13.2 secs (ref 11.5–14.7)

## 2011-05-13 LAB — CBC WITH DIFFERENTIAL/PLATELET
Basophil #: 0 10*3/uL (ref 0.0–0.1)
Eosinophil #: 0 10*3/uL (ref 0.0–0.7)
Eosinophil %: 0 %
HGB: 9.7 g/dL — ABNORMAL LOW (ref 13.0–18.0)
Lymphocyte %: 4.8 %
MCH: 29.8 pg (ref 26.0–34.0)
MCV: 92 fL (ref 80–100)
Monocyte #: 0.9 x10 3/mm (ref 0.2–1.0)
Monocyte %: 4.5 %
Neutrophil #: 17.4 10*3/uL — ABNORMAL HIGH (ref 1.4–6.5)
Platelet: 253 10*3/uL (ref 150–440)
WBC: 19.2 10*3/uL — ABNORMAL HIGH (ref 3.8–10.6)

## 2011-05-14 ENCOUNTER — Inpatient Hospital Stay: Payer: Self-pay | Admitting: Surgery

## 2011-05-14 LAB — CBC WITH DIFFERENTIAL/PLATELET
Basophil %: 0.2 %
Eosinophil %: 0.1 %
HGB: 9 g/dL — ABNORMAL LOW (ref 13.0–18.0)
Lymphocyte %: 8.5 %
MCH: 30.1 pg (ref 26.0–34.0)
MCHC: 32.6 g/dL (ref 32.0–36.0)
MCV: 92 fL (ref 80–100)
Monocyte #: 1.5 x10 3/mm — ABNORMAL HIGH (ref 0.2–1.0)
Monocyte %: 8.5 %
Neutrophil #: 14.4 10*3/uL — ABNORMAL HIGH (ref 1.4–6.5)
Neutrophil %: 82.7 %
Platelet: 224 10*3/uL (ref 150–440)
RBC: 3 10*6/uL — ABNORMAL LOW (ref 4.40–5.90)
RDW: 17.4 % — ABNORMAL HIGH (ref 11.5–14.5)
WBC: 17.4 10*3/uL — ABNORMAL HIGH (ref 3.8–10.6)

## 2011-05-14 LAB — HEMOGLOBIN: HGB: 7.7 g/dL — ABNORMAL LOW (ref 13.0–18.0)

## 2011-05-14 LAB — BASIC METABOLIC PANEL
Anion Gap: 8 (ref 7–16)
Calcium, Total: 8 mg/dL — ABNORMAL LOW (ref 8.5–10.1)
Chloride: 100 mmol/L (ref 98–107)
Creatinine: 1.05 mg/dL (ref 0.60–1.30)
EGFR (African American): >60
Osmolality: 269 (ref 275–301)
Potassium: 4.7 mmol/L (ref 3.5–5.1)
Sodium: 133 mmol/L — ABNORMAL LOW (ref 136–145)

## 2011-05-15 LAB — BASIC METABOLIC PANEL
Anion Gap: 9 (ref 7–16)
Calcium, Total: 8.2 mg/dL — ABNORMAL LOW (ref 8.5–10.1)
Chloride: 102 mmol/L (ref 98–107)
EGFR (African American): >60
Glucose: 106 mg/dL — ABNORMAL HIGH (ref 65–99)
Osmolality: 270 (ref 275–301)
Potassium: 4 mmol/L (ref 3.5–5.1)
Sodium: 136 mmol/L (ref 136–145)

## 2011-05-15 LAB — CBC WITH DIFFERENTIAL/PLATELET
Basophil #: 0 10*3/uL (ref 0.0–0.1)
Eosinophil #: 0.1 10*3/uL (ref 0.0–0.7)
Eosinophil %: 1.3 %
HCT: 24.3 % — ABNORMAL LOW (ref 40.0–52.0)
HGB: 8.2 g/dL — ABNORMAL LOW (ref 13.0–18.0)
MCHC: 33.9 g/dL (ref 32.0–36.0)
MCV: 90 fL (ref 80–100)
Monocyte #: 1.3 x10 3/mm — ABNORMAL HIGH (ref 0.2–1.0)
Monocyte %: 14.7 %
Platelet: 165 10*3/uL (ref 150–440)
RDW: 17.2 % — ABNORMAL HIGH (ref 11.5–14.5)
WBC: 9.1 10*3/uL (ref 3.8–10.6)

## 2011-05-16 LAB — CBC WITH DIFFERENTIAL/PLATELET
Basophil #: 0 10*3/uL (ref 0.0–0.1)
Basophil %: 0.7 %
Eosinophil #: 0.2 10*3/uL (ref 0.0–0.7)
Eosinophil %: 3.6 %
HCT: 22.2 % — ABNORMAL LOW (ref 40.0–52.0)
HGB: 7.5 g/dL — ABNORMAL LOW (ref 13.0–18.0)
Lymphocyte #: 0.9 10*3/uL — ABNORMAL LOW (ref 1.0–3.6)
Lymphocyte %: 13.9 %
MCHC: 33.7 g/dL (ref 32.0–36.0)
MCV: 90 fL (ref 80–100)
Monocyte #: 0.9 x10 3/mm (ref 0.2–1.0)
Neutrophil #: 4.6 10*3/uL (ref 1.4–6.5)
Platelet: 158 10*3/uL (ref 150–440)
RBC: 2.47 10*6/uL — ABNORMAL LOW (ref 4.40–5.90)
WBC: 6.8 10*3/uL (ref 3.8–10.6)

## 2011-05-23 ENCOUNTER — Observation Stay: Payer: Self-pay | Admitting: Surgery

## 2011-05-23 LAB — CBC
HCT: 27 % — ABNORMAL LOW (ref 40.0–52.0)
HGB: 8.9 g/dL — ABNORMAL LOW (ref 13.0–18.0)
MCH: 29.7 pg (ref 26.0–34.0)
MCHC: 33 g/dL (ref 32.0–36.0)
MCV: 90 fL (ref 80–100)
Platelet: 407 10*3/uL (ref 150–440)
RBC: 3 10*6/uL — ABNORMAL LOW (ref 4.40–5.90)
RDW: 16.7 % — ABNORMAL HIGH (ref 11.5–14.5)
WBC: 10.1 10*3/uL (ref 3.8–10.6)

## 2011-05-23 LAB — COMPREHENSIVE METABOLIC PANEL
Albumin: 3.6 g/dL (ref 3.4–5.0)
Anion Gap: 6 — ABNORMAL LOW (ref 7–16)
BUN: 19 mg/dL — ABNORMAL HIGH (ref 7–18)
Bilirubin,Total: 0.7 mg/dL (ref 0.2–1.0)
Calcium, Total: 8.9 mg/dL (ref 8.5–10.1)
Chloride: 103 mmol/L (ref 98–107)
EGFR (African American): >60
EGFR (Non-African Amer.): >60
Glucose: 120 mg/dL — ABNORMAL HIGH (ref 65–99)
Osmolality: 270 (ref 275–301)
Potassium: 4.6 mmol/L (ref 3.5–5.1)
SGOT(AST): 33 U/L (ref 15–37)
SGPT (ALT): 43 U/L
Sodium: 133 mmol/L — ABNORMAL LOW (ref 136–145)
Total Protein: 7.7 g/dL (ref 6.4–8.2)

## 2011-05-23 LAB — CBC WITH DIFFERENTIAL/PLATELET
Basophil #: 0.1 10*3/uL (ref 0.0–0.1)
Eosinophil %: 0.4 %
HGB: 8.8 g/dL — ABNORMAL LOW (ref 13.0–18.0)
Lymphocyte %: 5.2 %
MCH: 29.1 pg (ref 26.0–34.0)
MCHC: 32.3 g/dL (ref 32.0–36.0)
Monocyte #: 0.1 x10 3/mm — ABNORMAL LOW (ref 0.2–1.0)
Neutrophil #: 12.2 10*3/uL — ABNORMAL HIGH (ref 1.4–6.5)
Neutrophil %: 92.3 %
Platelet: 412 10*3/uL (ref 150–440)
RBC: 3.02 10*6/uL — ABNORMAL LOW (ref 4.40–5.90)
RDW: 16.4 % — ABNORMAL HIGH (ref 11.5–14.5)

## 2011-05-23 LAB — APTT: Activated PTT: 31.7 secs (ref 23.6–35.9)

## 2011-05-23 LAB — PROTIME-INR
INR: 0.9
Prothrombin Time: 12.9 secs (ref 11.5–14.7)

## 2011-05-23 LAB — FIBRINOGEN: Fibrinogen: 534 mg/dL — ABNORMAL HIGH (ref 210–470)

## 2011-06-30 ENCOUNTER — Ambulatory Visit: Payer: Self-pay | Admitting: Surgery

## 2011-06-30 LAB — CREATININE, SERUM: Creatinine: 0.93 mg/dL (ref 0.60–1.30)

## 2011-07-02 ENCOUNTER — Emergency Department: Payer: Self-pay | Admitting: Unknown Physician Specialty

## 2011-09-07 LAB — CBC
HCT: 32.3 % — ABNORMAL LOW (ref 40.0–52.0)
HGB: 9.9 g/dL — ABNORMAL LOW (ref 13.0–18.0)
MCH: 20.9 pg — ABNORMAL LOW (ref 26.0–34.0)
Platelet: 327 10*3/uL (ref 150–440)
RBC: 4.75 10*6/uL (ref 4.40–5.90)
WBC: 14.2 10*3/uL — ABNORMAL HIGH (ref 3.8–10.6)

## 2011-09-07 LAB — COMPREHENSIVE METABOLIC PANEL
Anion Gap: 8 (ref 7–16)
BUN: 12 mg/dL (ref 7–18)
Chloride: 102 mmol/L (ref 98–107)
Creatinine: 0.85 mg/dL (ref 0.60–1.30)
EGFR (African American): >60
EGFR (Non-African Amer.): >60
Potassium: 3.9 mmol/L (ref 3.5–5.1)
Sodium: 133 mmol/L — ABNORMAL LOW (ref 136–145)
Total Protein: 8.3 g/dL — ABNORMAL HIGH (ref 6.4–8.2)

## 2011-09-08 ENCOUNTER — Inpatient Hospital Stay: Payer: Self-pay | Admitting: Surgery

## 2011-09-08 LAB — PROTIME-INR
INR: 1
Prothrombin Time: 13.9 secs (ref 11.5–14.7)

## 2011-09-08 LAB — APTT: Activated PTT: 32.1 secs (ref 23.6–35.9)

## 2011-09-09 LAB — CBC WITH DIFFERENTIAL/PLATELET
Basophil #: 0.1 10*3/uL (ref 0.0–0.1)
Basophil %: 1.3 %
HCT: 30.6 % — ABNORMAL LOW (ref 40.0–52.0)
HGB: 9.6 g/dL — ABNORMAL LOW (ref 13.0–18.0)
Lymphocyte #: 1.4 10*3/uL (ref 1.0–3.6)
Lymphocyte %: 21.2 %
MCH: 21.6 pg — ABNORMAL LOW (ref 26.0–34.0)
MCHC: 31.5 g/dL — ABNORMAL LOW (ref 32.0–36.0)
MCV: 69 fL — ABNORMAL LOW (ref 80–100)
Monocyte #: 0.7 x10 3/mm (ref 0.2–1.0)
Monocyte %: 10.2 %
Neutrophil #: 4 10*3/uL (ref 1.4–6.5)
RBC: 4.45 10*6/uL (ref 4.40–5.90)
RDW: 21.2 % — ABNORMAL HIGH (ref 11.5–14.5)
WBC: 6.7 10*3/uL (ref 3.8–10.6)

## 2011-09-09 LAB — VANCOMYCIN, TROUGH: Vancomycin, Trough: 19 ug/mL (ref 10–20)

## 2011-09-09 LAB — COMPREHENSIVE METABOLIC PANEL
Alkaline Phosphatase: 95 U/L (ref 50–136)
BUN: 7 mg/dL (ref 7–18)
Bilirubin,Total: 0.3 mg/dL (ref 0.2–1.0)
Co2: 27 mmol/L (ref 21–32)
Creatinine: 0.87 mg/dL (ref 0.60–1.30)
EGFR (African American): >60
EGFR (Non-African Amer.): >60
Glucose: 117 mg/dL — ABNORMAL HIGH (ref 65–99)
SGOT(AST): 25 U/L (ref 15–37)
SGPT (ALT): 20 U/L (ref 12–78)
Sodium: 138 mmol/L (ref 136–145)
Total Protein: 7.2 g/dL (ref 6.4–8.2)

## 2011-09-10 LAB — CREATININE, SERUM: Creatinine: 0.98 mg/dL (ref 0.60–1.30)

## 2011-09-29 ENCOUNTER — Emergency Department: Payer: Self-pay | Admitting: Emergency Medicine

## 2011-10-23 ENCOUNTER — Emergency Department: Payer: Self-pay | Admitting: Unknown Physician Specialty

## 2011-10-23 LAB — CBC
HCT: 30.9 % — ABNORMAL LOW (ref 40.0–52.0)
MCH: 21 pg — ABNORMAL LOW (ref 26.0–34.0)
MCHC: 30.6 g/dL — ABNORMAL LOW (ref 32.0–36.0)
MCV: 69 fL — ABNORMAL LOW (ref 80–100)
Platelet: 352 10*3/uL (ref 150–440)
RDW: 22.4 % — ABNORMAL HIGH (ref 11.5–14.5)

## 2011-10-23 LAB — COMPREHENSIVE METABOLIC PANEL
Albumin: 3.6 g/dL (ref 3.4–5.0)
Alkaline Phosphatase: 105 U/L (ref 50–136)
BUN: 19 mg/dL — ABNORMAL HIGH (ref 7–18)
Bilirubin,Total: 0.3 mg/dL (ref 0.2–1.0)
Chloride: 111 mmol/L — ABNORMAL HIGH (ref 98–107)
Co2: 23 mmol/L (ref 21–32)
Creatinine: 1.24 mg/dL (ref 0.60–1.30)
EGFR (African American): >60
EGFR (Non-African Amer.): >60
Glucose: 99 mg/dL (ref 65–99)
Potassium: 4.5 mmol/L (ref 3.5–5.1)
SGOT(AST): 36 U/L (ref 15–37)
SGPT (ALT): 54 U/L (ref 12–78)
Total Protein: 7.9 g/dL (ref 6.4–8.2)

## 2011-10-23 LAB — URINALYSIS, COMPLETE
Bacteria: NONE SEEN
Bilirubin,UR: NEGATIVE
Blood: NEGATIVE
Hyaline Cast: 1
Ketone: NEGATIVE
Leukocyte Esterase: NEGATIVE
Ph: 5 (ref 4.5–8.0)
Squamous Epithelial: 1
WBC UR: NONE SEEN /HPF (ref 0–5)

## 2011-11-09 ENCOUNTER — Emergency Department: Payer: Self-pay | Admitting: Emergency Medicine

## 2011-11-09 LAB — BASIC METABOLIC PANEL
Anion Gap: 5 — ABNORMAL LOW (ref 7–16)
BUN: 17 mg/dL (ref 7–18)
Chloride: 107 mmol/L (ref 98–107)
Creatinine: 0.78 mg/dL (ref 0.60–1.30)
EGFR (African American): >60
EGFR (Non-African Amer.): >60
Sodium: 139 mmol/L (ref 136–145)

## 2011-11-09 LAB — CBC
MCV: 69 fL — ABNORMAL LOW (ref 80–100)
Platelet: 365 10*3/uL (ref 150–440)
RBC: 4.88 10*6/uL (ref 4.40–5.90)
RDW: 22.9 % — ABNORMAL HIGH (ref 11.5–14.5)
WBC: 9.3 10*3/uL (ref 3.8–10.6)

## 2011-11-10 ENCOUNTER — Ambulatory Visit: Payer: Self-pay | Admitting: Surgery

## 2011-11-16 ENCOUNTER — Inpatient Hospital Stay: Payer: Self-pay | Admitting: Surgery

## 2011-11-17 LAB — BASIC METABOLIC PANEL
Anion Gap: 7 (ref 7–16)
BUN: 6 mg/dL — ABNORMAL LOW (ref 7–18)
Chloride: 106 mmol/L (ref 98–107)
Creatinine: 0.95 mg/dL (ref 0.60–1.30)
EGFR (African American): >60
Potassium: 4.1 mmol/L (ref 3.5–5.1)

## 2011-11-17 LAB — CBC WITH DIFFERENTIAL/PLATELET
Basophil #: 0.1 10*3/uL (ref 0.0–0.1)
HGB: 9.5 g/dL — ABNORMAL LOW (ref 13.0–18.0)
Lymphocyte #: 2 10*3/uL (ref 1.0–3.6)
Lymphocyte %: 22 %
MCV: 70 fL — ABNORMAL LOW (ref 80–100)
Monocyte #: 1.1 x10 3/mm — ABNORMAL HIGH (ref 0.2–1.0)
Monocyte %: 12.3 %
Platelet: 268 10*3/uL (ref 150–440)
RBC: 4.26 10*6/uL — ABNORMAL LOW (ref 4.40–5.90)
WBC: 8.9 10*3/uL (ref 3.8–10.6)

## 2011-11-18 LAB — CBC WITH DIFFERENTIAL/PLATELET
Basophil #: 0.1 10*3/uL (ref 0.0–0.1)
Eosinophil #: 0.5 10*3/uL (ref 0.0–0.7)
Lymphocyte #: 1.6 10*3/uL (ref 1.0–3.6)
Lymphocyte %: 21.5 %
MCHC: 31.2 g/dL — ABNORMAL LOW (ref 32.0–36.0)
Monocyte %: 14.1 %
Neutrophil #: 4.2 10*3/uL (ref 1.4–6.5)
Neutrophil %: 56.6 %
Platelet: 277 10*3/uL (ref 150–440)
RBC: 4.33 10*6/uL — ABNORMAL LOW (ref 4.40–5.90)
RDW: 23.2 % — ABNORMAL HIGH (ref 11.5–14.5)
WBC: 7.4 10*3/uL (ref 3.8–10.6)

## 2011-11-20 DEATH — deceased
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
## 2014-05-08 NOTE — Discharge Summary (Signed)
PATIENT NAME:  Mark Peck, Mark Peck DATE OF BIRTH:  09/08/1980  DATE OF ADMISSION:  09/08/2011 DATE OF DISCHARGE:  09/10/2011  BRIEF HISTORY: Mark HodgkinsChristopher Peck is a 34 year old gentleman with a ventral hernia repair several months ago complicated by postoperative seroma. He was doing well post discharge, but returned to the Emergency Room this evening with some drainage from his anterior abdominal wall. Spontaneous drainage was noted and there was concern that the patient had developed a graft infection. CT scan did show some fluid in the suprafascial space. There was no significant change in the subfascial area. He had no fever, no elevated white blood cell count. He was placed on antibiotics and pain medication. His drainage has decreased dramatically and he has remained afebrile. There is no sign of any graft infection at the present time. We will discharge him home on pain medication and follow him up next week in the office and make a decision about whether or not to re-image his abdominal wall. At the present time I think the current drainage is related to his previous seroma and is not related to a graft infection. This plan has been discussed with the patient and he is in agreement.   DISCHARGE MEDICATIONS: 1. Paxil 20 mg p.o. daily.  2. Percocet 10/325 p.o. q.6 hours p.r.n.    ____________________________ Carmie Endalph L. Ely III, MD rle:ap D: 09/10/2011 07:42:57 ET T: 09/10/2011 12:13:16 ET JOB#: [id number]  cc: Quentin Orealph L. Ely III, MD, <Dictator> Quentin OreALPH L ELY MD ELECTRONICALLY SIGNED 09/10/2011 16:41
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
## 2014-05-13 NOTE — Consult Note (Signed)
Brief Consult Note: Diagnosis: Polysubstance dependence, substance induced mood disorder.   Patient was seen by consultant.   Consult note dictated.   Recommend further assessment or treatment.   Orders entered.   Discussed with Attending MD.   Comments: [name] has a h/o substance dependence and untreated depression that worsen 4 mo ago after his father passed away. He was admitted to CCU after an unintentional overdose on multiple prescription pills. He denies thoughts of hurting himself or others.   PLAN: 1. The patient does not meet criteria for IVC.   2. Mood-I will start Prozac. [facility]   3. Substance abuse treatment-the patient declines. Will provide info on [facility], a local free outpatient treatment program.   4. Sitter-he is not suicidal and does not need a sitter. He however makes it clear that he would walk away if not supervised.   5. I will follow up.  Electronic Signatures: Kristine LineaPucilowska, Nylene Inlow (MD)  (Signed 11-Feb-13 12:29)  Authored: Brief Consult Note   Last Updated: 11-Feb-13 12:29 by Kristine LineaPucilowska, Wandell Scullion (MD)

## 2014-05-13 NOTE — Op Note (Signed)
PATIENT NAME:  Mark Peck, Mark Peck MR#:  [id number] DATE OF BIRTH:  1980-12-03  DATE OF PROCEDURE:  05/23/2011  PREOPERATIVE DIAGNOSIS: Postoperative subcutaneous hematoma and skin dehiscence.  POSTOPERATIVE DIAGNOSIS: Postoperative subcutaneous hematoma and skin dehiscence.   OPERATION PERFORMED:  1. Evacuation of hematoma.  2. Reclosure of surgical wound.   SURGEON: Claude MangesWilliam F. Leeann Bady, MD   ESTIMATED BLOOD LOSS: 1200 mL of old blood.   FINDINGS: No active bleeding.   PROCEDURE IN DETAIL: The patient was placed supine on the operating room table and prepped and draped in the usual sterile fashion. The three Kerlix sponges that I placed in the Emergency Room were removed and the hematoma was removed from the extensive subcutaneous undermined skin flaps bilaterally leaving the umbilicus reattached as Dr. Michela PitcherEly had left it 10 days ago. Then the wound was irrigated with copious amounts of warm normal saline. This was suctioned clear. There was no active bleeding. Three channel-type Blake drains were placed. Two were placed laterally and brought out inferiorly and the other was placed in the midline and brought out superiorly. The skin was then reapproximated with interrupted vertical mattress sutures of 2-0 nylon and 2-0 nylon was also used to secure the three Blake drains and they were all placed to bulb suction. A sterile dressing was applied. The patient tolerated the procedure well. There were no complications.   ____________________________ Claude MangesWilliam F. Jelissa Espiritu, MD wfm:drc D: 05/23/2011 05:15:44 ET T: 05/23/2011 09:34:45 ET JOB#: [id number]  cc: Claude MangesWilliam F. Kaz Auld, MD, <Dictator> Claude MangesWILLIAM F Karas Pickerill MD ELECTRONICALLY SIGNED 05/23/2011 21:54
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
